# Patient Record
Sex: Female | Born: 1970 | Race: White | Hispanic: No | Marital: Married | Smoking: Never smoker
Health system: Southern US, Community
[De-identification: ages and names within clinical notes are randomized; demographics above are authoritative.]

## PROBLEM LIST (undated history)

## (undated) DIAGNOSIS — G35D Multiple sclerosis, unspecified: Secondary | ICD-10-CM

## (undated) DIAGNOSIS — G35 Multiple sclerosis: Secondary | ICD-10-CM

## (undated) DIAGNOSIS — H539 Unspecified visual disturbance: Secondary | ICD-10-CM

## (undated) DIAGNOSIS — N979 Female infertility, unspecified: Secondary | ICD-10-CM

## (undated) DIAGNOSIS — E119 Type 2 diabetes mellitus without complications: Secondary | ICD-10-CM

## (undated) HISTORY — DX: Multiple sclerosis, unspecified: G35.D

## (undated) HISTORY — DX: Female infertility, unspecified: N97.9

## (undated) HISTORY — PX: LAPAROSCOPIC APPENDECTOMY: SHX408

## (undated) HISTORY — DX: Multiple sclerosis: G35

## (undated) HISTORY — DX: Type 2 diabetes mellitus without complications: E11.9

## (undated) HISTORY — PX: EYE SURGERY: SHX253

## (undated) HISTORY — DX: Unspecified visual disturbance: H53.9

---

## 2004-05-20 ENCOUNTER — Encounter (INDEPENDENT_AMBULATORY_CARE_PROVIDER_SITE_OTHER): Payer: Self-pay | Admitting: *Deleted

## 2004-05-20 ENCOUNTER — Ambulatory Visit (HOSPITAL_COMMUNITY): Admission: RE | Admit: 2004-05-20 | Discharge: 2004-05-20 | Payer: Self-pay | Admitting: Obstetrics and Gynecology

## 2005-08-21 ENCOUNTER — Ambulatory Visit (HOSPITAL_COMMUNITY): Admission: RE | Admit: 2005-08-21 | Discharge: 2005-08-21 | Payer: Self-pay | Admitting: Obstetrics and Gynecology

## 2008-04-28 ENCOUNTER — Inpatient Hospital Stay (HOSPITAL_COMMUNITY): Admission: AD | Admit: 2008-04-28 | Discharge: 2008-04-30 | Payer: Self-pay | Admitting: Obstetrics and Gynecology

## 2008-04-28 ENCOUNTER — Encounter (INDEPENDENT_AMBULATORY_CARE_PROVIDER_SITE_OTHER): Payer: Self-pay | Admitting: Obstetrics and Gynecology

## 2010-07-26 ENCOUNTER — Ambulatory Visit (HOSPITAL_BASED_OUTPATIENT_CLINIC_OR_DEPARTMENT_OTHER)
Admission: RE | Admit: 2010-07-26 | Discharge: 2010-07-26 | Payer: Self-pay | Source: Home / Self Care | Attending: Obstetrics and Gynecology | Admitting: Obstetrics and Gynecology

## 2010-12-03 NOTE — Op Note (Signed)
NAME:  Denise Franklin, Denise Franklin           ACCOUNT NO.:  [id number]   MEDICAL RECORD NO.:  [id number]          PATIENT TYPE:  AMB   LOCATION:  SDC                           FACILITY:  [facility]   PHYSICIAN:  Maxie Better, M.D.DATE OF BIRTH:  June 05, 1971   DATE OF PROCEDURE:  05/20/2004  DATE OF DISCHARGE:                                 OPERATIVE REPORT   PREOPERATIVE DIAGNOSES:  1.  Dysfunctional uterine bleeding.  2.  Endometrial masses.   PROCEDURE:  Diagnostic hysteroscopy, resection of endometrial masses,  dilation and curettage.   POSTOPERATIVE DIAGNOSES:  1.  Dysfunctional uterine bleeding.  2.  Endometrial masses.   ANESTHESIA:  MAC, paracervical block.   SURGEON:  Maxie Better, M.D.   PROCEDURE:  Under adequate monitored anesthesia, the patient was placed in  the dorsal lithotomy position.  She was sterilely prepped and draped in the  usual fashion.  Bladder was catheterized for a large amount of urine.  Examination under anesthesia revealed a retroverted uterus.  No adnexal  masses could be appreciated.  A __________ speculum was placed in the  vagina.  The cervix was noted to be parous.  A single-tooth tenaculum was  placed on the anterior lip of the cervix after 20 cc of 1% Nesacaine was  injected paracervically.  The cervix was then serially dilated up to a #31  Pratt dilator.  A resectoscope was introduced into the uterine cavity  without incident.  On inspection, both tubal ostia could not be seen well  due to polypoid lesions noted, particularly on the right lateral aspect of  the uterus, as well as posterior and left.  These areas were resected.  The  tubal ostial areas could subsequently be seen after resection of all  polypoid lesions was then performed.  The resectoscope was then removed.  Cavity was then gently curetted.  Resectoscope was then reinserted for  inspection.  Then, the cervical canal was inspected.  No lesions noted.  The  uterine cavity was  thought to be well resected of endometrial lesions, at  which time the decision was then made to remove all instruments from the  vagina without repeating the curettage.  Specimens labeled endometrial  resections and endometrial curetting were sent to pathology.   ESTIMATED BLOOD LOSS:  Minimal.   COMPLICATIONS:  None.   The patient tolerated the procedure well and was transferred to the recovery  room in stable condition.    [place]/MEDQ  D:  05/20/2004  T:  05/21/2004  Job:  [id number]

## 2011-04-18 LAB — CBC
Hemoglobin: 12.4
Hemoglobin: 9.8 — ABNORMAL LOW
MCV: 82.6
MCV: 82.8
RBC: 3.54 — ABNORMAL LOW
RBC: 4.62
RDW: 16.1 — ABNORMAL HIGH
WBC: 11.4 — ABNORMAL HIGH

## 2011-08-03 ENCOUNTER — Other Ambulatory Visit (HOSPITAL_BASED_OUTPATIENT_CLINIC_OR_DEPARTMENT_OTHER): Payer: Self-pay | Admitting: Obstetrics and Gynecology

## 2011-08-03 DIAGNOSIS — Z1231 Encounter for screening mammogram for malignant neoplasm of breast: Secondary | ICD-10-CM

## 2011-08-09 ENCOUNTER — Ambulatory Visit (HOSPITAL_BASED_OUTPATIENT_CLINIC_OR_DEPARTMENT_OTHER)
Admission: RE | Admit: 2011-08-09 | Discharge: 2011-08-09 | Disposition: A | Discharge status: Home / Self Care | Payer: BC Managed Care – PPO | Source: Ambulatory Visit | Attending: Obstetrics and Gynecology | Admitting: Obstetrics and Gynecology

## 2011-08-09 DIAGNOSIS — Z1231 Encounter for screening mammogram for malignant neoplasm of breast: Secondary | ICD-10-CM | POA: Insufficient documentation

## 2011-12-21 ENCOUNTER — Telehealth: Payer: Self-pay | Admitting: *Deleted

## 2011-12-21 NOTE — Telephone Encounter (Signed)
Confirmed 02/02/12 genetics appt w/ pt.  Denise Franklin at referring to make aware. [place]el Neth paperwork.

## 2012-02-01 ENCOUNTER — Encounter: Payer: Self-pay | Admitting: *Deleted

## 2012-02-01 NOTE — Progress Notes (Signed)
Per Clydie Braun - pt is coming in w/ sister at 10am.  Move appt time.

## 2012-02-02 ENCOUNTER — Ambulatory Visit (HOSPITAL_BASED_OUTPATIENT_CLINIC_OR_DEPARTMENT_OTHER): Payer: BC Managed Care – PPO | Admitting: Genetic Counselor

## 2012-02-02 ENCOUNTER — Other Ambulatory Visit: Payer: BC Managed Care – PPO | Admitting: Lab

## 2012-02-02 ENCOUNTER — Encounter: Payer: Self-pay | Admitting: Genetic Counselor

## 2012-02-02 DIAGNOSIS — Z808 Family history of malignant neoplasm of other organs or systems: Secondary | ICD-10-CM

## 2012-02-02 DIAGNOSIS — Z803 Family history of malignant neoplasm of breast: Secondary | ICD-10-CM

## 2012-02-02 DIAGNOSIS — IMO0002 Reserved for concepts with insufficient information to code with codable children: Secondary | ICD-10-CM

## 2012-02-02 NOTE — Progress Notes (Signed)
Dr. Cherly Hensen requested a consultation for genetic counseling and risk assessment for Denise Franklin, a 41 y.o. female, for discussion of her family history of cancer. She presents to clinic today to discuss the possibility of a genetic predisposition to cancer, and to further clarify her risks, as well as her family members' risks for cancer.   HISTORY OF PRESENT ILLNESS: Denise Franklin is a 41 y.o. female with no personal history of cancer.    Past Medical History  Diagnosis Date  . Infertility, female     No past surgical history on file.  History  Substance Use Topics  . Smoking status: Not on file  . Smokeless tobacco: Not on file  . Alcohol Use:     REPRODUCTIVE HISTORY AND PERSONAL RISK ASSESSMENT FACTORS: Menarche was at age 26.   Premenopausal Uterus Intact: Yes Ovaries Intact: Yes G8P2A6 , first live birth at age 73  She has previously undergone treatment for infertility.   OCP use for 8 years   She has not used HRT in the past.    FAMILY HISTORY:  We obtained a detailed, 4-generation family history.  Significant diagnoses are listed below: Family History  Problem Relation Age of Onset  . Breast cancer Mother 66    Stage IV  . Kidney failure Father   . Breast cancer Sister 60    DCIS  . Melanoma Maternal Aunt 51    mets to brain, died at 33  . Lung cancer Maternal Uncle 76    smoker  . Brain cancer Maternal Grandmother 65    unknown primary cancer  . Colon cancer Maternal Grandfather 72  . Breast cancer Maternal Aunt 50    second cancer, 67  The patient has two full brothers,one full sister, two paternal half sisters and one paternal half brother.  A half sister was diagnosed with DCIS at age 37.  The patient's mother was diagnosed with breast cancer, Stage IV at age 10.  She has two sisters and two brothers.  One sister was diagnosed with breast cancer at ages 78 and 74 and died at 38.  Another sister was diagnosed with melanoma that metastases to the  brain at age 77 and died at 58. A brother died of lifestyle issues at 45 and the other brother was diagnosed with smoking related lung cancer.  Her maternal grandmother was diagnosed with brain cancer at 73, but the primary cancer was unknown. She had a sister with an unknown cancer and a mother with leukemia.  The patient's maternal grandfather was diagnosed with colon cancer at 35.  Patient's maternal ancestors are of [place] and [place] descent, and paternal ancestors are of [place], [place] and [place] descent. There is no reported Ashkenazi Jewish ancestry. There is no  known consanguinity.  GENETIC COUNSELING RISK ASSESSMENT, DISCUSSION, AND SUGGESTED FOLLOW UP: We reviewed the natural history and genetic etiology of sporadic, familial and hereditary cancer syndromes.  About 5-10% of breast cancer is hereditary.  Of this, about 85% is the result of a BRCA1 or BRCA2 mutation.  We reviewed the red flags of hereditary cancer syndromes and the dominant inheritance patterns.  If the BRCA testing is negative, we discussed that we could be testing for the wrong gene.  We discussed gene panels, and that several cancer genes that are associated with different cancers can be tested at the same time.  Because of the different types of cancer that are in the patient's family, we will consider one of the panel  tests if she is negative for BRCA mutations. We also discussed that testing someone with cancer would be more informative and we could then test the patient for a mutation if one was found in a family member.  She will talk with her mother about being tested.  The patient's family history is suggestive of the following possible diagnosis: hereditary cancer syndrome  We discussed that identification of a hereditary cancer syndrome may help her care providers tailor the patients medical management. If a mutation indicating a hereditary cancer syndrome is detected in this case, the [employer] recommendations would include increased cancer surveillance and possible prophylactic surgery. If a mutation is detected, the patient will be referred back to the referring provider and to any additional appropriate care providers to discuss the relevant options.   If a mutation is not found in the patient, this will decrease the likelihood of a hereditary cancer syndrome as the explanation for her family history. Cancer surveillance options would be discussed for the patient according to the appropriate standard National Comprehensive Cancer Network and American Cancer Society guidelines, with consideration of their personal and family history risk factors. In this case, the patient will be referred back to their care providers for discussions of management.   In order to estimate her chance of having a BRCA1 or BRCA2 mutation, we used statistical models (Penn II) and laboratory data that take into account her personal medical history, family history and ancestry.  Because each model is different, there can be a lot of variability in the risks they give.  Therefore, these numbers must be considered a rough range and not a precise risk of having a BRCA1 or BRCA2 mutation.  These models estimate that she has approximately a 5% chance of having a mutation.   After considering the risks, benefits, and limitations, the patient decided to talk with other family members before pursuing genetic testing.   Per the patient's request, we will contact her by telephone to discuss these results. A follow up genetic counseling visit will be scheduled if indicated.  The patient was seen for a total of 60 minutes, greater than 50% of which was spent face-to-face counseling.  This plan is being carried out per Dr. Purnell Shoemaker recommendations.  This note will also be sent to the referring provider via the electronic medical record. The patient will be supplied with a summary of this genetic counseling discussion as  well as educational information on the discussed hereditary cancer syndromes following the conclusion of their visit.   Patient was discussed with Dr. Drue Second.    _______________________________________________________________________ For Office Staff:  Number of people involved in session: 3 Was an Intern/ student involved with case: not applicable

## 2012-07-23 ENCOUNTER — Other Ambulatory Visit (HOSPITAL_BASED_OUTPATIENT_CLINIC_OR_DEPARTMENT_OTHER): Payer: Self-pay | Admitting: Obstetrics and Gynecology

## 2012-07-23 DIAGNOSIS — Z1231 Encounter for screening mammogram for malignant neoplasm of breast: Secondary | ICD-10-CM

## 2012-08-10 ENCOUNTER — Ambulatory Visit (HOSPITAL_BASED_OUTPATIENT_CLINIC_OR_DEPARTMENT_OTHER)
Admission: RE | Admit: 2012-08-10 | Discharge: 2012-08-10 | Disposition: A | Discharge status: Home / Self Care | Payer: BC Managed Care – PPO | Source: Ambulatory Visit | Attending: Obstetrics and Gynecology | Admitting: Obstetrics and Gynecology

## 2012-08-10 DIAGNOSIS — Z1231 Encounter for screening mammogram for malignant neoplasm of breast: Secondary | ICD-10-CM | POA: Insufficient documentation

## 2013-09-04 ENCOUNTER — Other Ambulatory Visit (HOSPITAL_BASED_OUTPATIENT_CLINIC_OR_DEPARTMENT_OTHER): Payer: Self-pay | Admitting: Obstetrics and Gynecology

## 2013-09-04 DIAGNOSIS — Z1231 Encounter for screening mammogram for malignant neoplasm of breast: Secondary | ICD-10-CM

## 2013-10-14 ENCOUNTER — Ambulatory Visit (HOSPITAL_BASED_OUTPATIENT_CLINIC_OR_DEPARTMENT_OTHER)
Admission: RE | Admit: 2013-10-14 | Discharge: 2013-10-14 | Disposition: A | Discharge status: Home / Self Care | Payer: 59 | Source: Ambulatory Visit | Attending: Obstetrics and Gynecology | Admitting: Obstetrics and Gynecology

## 2013-10-14 DIAGNOSIS — Z1231 Encounter for screening mammogram for malignant neoplasm of breast: Secondary | ICD-10-CM | POA: Insufficient documentation

## 2014-11-24 ENCOUNTER — Other Ambulatory Visit (HOSPITAL_BASED_OUTPATIENT_CLINIC_OR_DEPARTMENT_OTHER): Payer: Self-pay | Admitting: Obstetrics and Gynecology

## 2014-11-24 DIAGNOSIS — Z1231 Encounter for screening mammogram for malignant neoplasm of breast: Secondary | ICD-10-CM

## 2014-12-01 ENCOUNTER — Ambulatory Visit (HOSPITAL_BASED_OUTPATIENT_CLINIC_OR_DEPARTMENT_OTHER)
Admission: RE | Admit: 2014-12-01 | Discharge: 2014-12-01 | Disposition: A | Discharge status: Home / Self Care | Payer: 59 | Source: Ambulatory Visit | Attending: Obstetrics and Gynecology | Admitting: Obstetrics and Gynecology

## 2014-12-01 DIAGNOSIS — Z1231 Encounter for screening mammogram for malignant neoplasm of breast: Secondary | ICD-10-CM | POA: Insufficient documentation

## 2014-12-18 ENCOUNTER — Ambulatory Visit (INDEPENDENT_AMBULATORY_CARE_PROVIDER_SITE_OTHER): Payer: 59 | Admitting: Neurology

## 2014-12-18 ENCOUNTER — Encounter: Payer: Self-pay | Admitting: Neurology

## 2014-12-18 VITALS — BP 146/92 | HR 74 | Resp 16 | Ht 65.0 in | Wt 213.8 lb

## 2014-12-18 DIAGNOSIS — Z79899 Other long term (current) drug therapy: Secondary | ICD-10-CM | POA: Diagnosis not present

## 2014-12-18 DIAGNOSIS — R5383 Other fatigue: Secondary | ICD-10-CM | POA: Insufficient documentation

## 2014-12-18 DIAGNOSIS — R7989 Other specified abnormal findings of blood chemistry: Secondary | ICD-10-CM | POA: Insufficient documentation

## 2014-12-18 DIAGNOSIS — G35 Multiple sclerosis: Secondary | ICD-10-CM | POA: Diagnosis not present

## 2014-12-18 DIAGNOSIS — R945 Abnormal results of liver function studies: Secondary | ICD-10-CM

## 2014-12-18 NOTE — Progress Notes (Signed)
[facility]  PATIENT: Denise Franklin. Ayllon DOB: August 12, 1970  REFERRING DOCTOR OR PCP:  Ezequiel Ganser SOURCE: patient  _________________________________   HISTORICAL  CHIEF COMPLAINT:  Chief Complaint  Patient presents with  . Multiple Sclerosis    Sts. she tolerates Rebif with some side effects--she wakes at night with chills, but no fever.  Ibuprofen helps.  Sts. fatigue is some worse with the heat. She had a physical with her pcp last week, would like to discuss lab results.  Sts. alk phos was 131, then was rechecked 12-16-14 and was 141, so her pcp has orderd a liver u/s./fim    HISTORY OF PRESENT ILLNESS:  Denise Franklin is a 44 yo woman who presented with left arm dysesthesias that spread to the face in June 2012. She then had an MRI of the brain that was consistent with multiple sclerosis and she was diagnosed. She started on Rebif in September 2012. She continues on Rebif and generally has tolerated it well. She has had some skin reactions but they have not been severe. Since starting Rebif she has not had any more exacerbations. Follow-up MRI in 2013 and 2014 both showed that there was a stable pattern of demyelinating foci. There were no new lesions. More recently she had laboratory tests performed showing elevated alkaline phosphatase of 131 and then rechecked and still elevated at 141. Her primary care doctor has ordered a liver function tests. Unfortunate, I do not have records from [facility] neurology so I do not know what her last blood test have been.  Gait/strength/sensation: She denies any significant problem with her gait. Occasionally she feels a little off balance but this has never been a major issue for her. She denies any weakness or numbness.  Bladder: She denies any difficulty with bladder frequency or hesitancy. She is not getting urinary tract infections.  Vision: She denies any MS related vision disorder.   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 She is reluctant to consider surgery as she could end up with diplopia and she currently does not have any problems with that.  Fatigue/sleep: She does note fatigue at times. This is usually much worse when the temperatures are elevated. Sometimes, the fatigue is bad enough that she doesn't want to get off the couch. She feels it is both physical and cognitive. She generally sleeps well though she has some nights where she has mild insomnia. She has used melatonin a few times with some benefit. She does not think her insomnia is bad enough to consider medication at this time.  Mood/cognition: She denies depression. She was able to get off of Prozac last year. She some anxiety associated with some of her shots in the past but is doing much better with just ibuprofen so no longer is taking Xanax for these. She denies any significant problem with cognition.  She has noted some word finding difficulties at times and occasionally some difficulty with focus but neither of these is a severe problem for her.  REVIEW OF SYSTEMS: Constitutional: No fevers, chills, sweats, or change in appetite.  Notes  fatigue Eyes: No visual changes, double vision, eye pain Ear, nose and throat: No hearing loss, ear pain, nasal congestion, sore throat Cardiovascular: No chest pain, palpitations Respiratory: No shortness of breath at rest or with exertion.   No wheezes GastrointestinaI: No nausea, vomiting, diarrhea, abdominal pain, fecal incontinence Genitourinary: No dysuria, urinary retention or frequency.  No nocturia. Musculoskeletal: No neck pain,  back pain Integumentary: No rash, pruritus, skin lesions Neurological: as above Psychiatric: No depression at this time.  Some anxiety Endocrine: No palpitations, diaphoresis, change in appetite, change in weigh or increased thirst Hematologic/Lymphatic: No anemia, purpura, petechiae. Allergic/Immunologic: No itchy/runny eyes,  nasal congestion, recent allergic reactions, rashes  ALLERGIES: No Known Allergies  HOME MEDICATIONS:  Current outpatient prescriptions:  .  cholecalciferol (VITAMIN D) 1000 UNITS tablet, Take 2,000 Units by mouth daily., Disp: , Rfl:  .  ibuprofen (ADVIL,MOTRIN) 400 MG tablet, Take 400 mg by mouth., Disp: , Rfl:  .  interferon beta-1a (REBIF) 44 MCG/0.5ML SOSY injection, Inject into the skin., Disp: , Rfl:  .  Multiple Vitamin (MULTIVITAMIN) tablet, Take 1 tablet by mouth daily., Disp: , Rfl:  .  Omega-3 Fatty Acids (FISH OIL) 1000 MG CAPS, Take 1,000 each by mouth daily., Disp: , Rfl:   PAST MEDICAL HISTORY: Past Medical History  Diagnosis Date  . Infertility, female   . Multiple sclerosis   . Vision abnormalities     PAST SURGICAL HISTORY: Past Surgical History  Procedure Laterality Date  . Eye surgery Right   . Laparoscopic appendectomy      FAMILY HISTORY: Family History  Problem Relation Age of Onset  . Breast cancer Mother 71    Stage IV  . Kidney failure Father   . Diabetes type II Father   . Breast cancer Sister 79    DCIS  . Melanoma Maternal Aunt 51    mets to brain, died at 61  . Lung cancer Maternal Uncle 71    smoker  . Brain cancer Maternal Grandmother 36    unknown primary cancer  . Colon cancer Maternal Grandfather 41  . Breast cancer Maternal Aunt 50    second cancer, 36    SOCIAL HISTORY:  History   Social History  . Marital Status: Married    Spouse Name: N/A  . Number of Children: N/A  . Years of Education: N/A   Occupational History  . Not on file.   Social History Main Topics  . Smoking status: Never Smoker   . Smokeless tobacco: Not on file  . Alcohol Use: No  . Drug Use: No  . Sexual Activity: Not on file   Other Topics Concern  . Not on file   Social History Narrative     PHYSICAL EXAM  Filed Vitals:   12/18/14 1320  BP: 146/92  Pulse: 74  Resp: 16  Height: '5\' 5"'  (1.651 m)  Weight: 213 lb 12.8 oz (96.979 kg)     Body mass index is 35.58 kg/(m^2).   General: The patient is well-developed and well-nourished and in no acute distress  Eyes:  Funduscopic exam shows normal optic discs and retinal vessels.  Neck: The neck is supple, no carotid bruits are noted.  The neck is nontender.  Cardiovascular: The heart has a regular rate and rhythm with a normal S1 and S2. There were no murmurs, gallops or rubs. Lungs are clear to auscultation.  Skin: Extremities are without significant edema.  Musculoskeletal:  Back is nontender  Neurologic Exam  Mental status: The patient is alert and oriented x 3 at the time of the examination. The patient has apparent normal recent and remote memory, with an apparently normal attention span and concentration ability.   Speech is normal.  Cranial nerves: Extraocular movements are full. Pupils are equal, round, and reactive to light and accomodation.  Visual fields are full.  Facial symmetry is present. There  is good facial sensation to soft touch bilaterally.Facial strength is normal.  Trapezius and sternocleidomastoid strength is normal. No dysarthria is noted.  The tongue is midline, and the patient has symmetric elevation of the soft palate. No obvious hearing deficits are noted.  Motor:  Muscle bulk is normal.   Tone is normal. Strength is  5 / 5 in all 4 extremities.   Sensory: Sensory testing is intact to pinprick, soft touch and vibration sensation in all 4 extremities.  Coordination: Cerebellar testing reveals good finger-nose-finger and heel-to-shin bilaterally.  Gait and station: Station is normal.   Gait is normal. Tandem gait is normal. Romberg is negative.   Reflexes: Deep tendon reflexes are symmetric and normal bilaterally.   Plantar responses are flexor.    DIAGNOSTIC DATA (LABS, IMAGING, TESTING) - I reviewed patient records, labs, notes, testing and imaging myself where available.      ASSESSMENT AND PLAN  Multiple sclerosis - Plan:  Hepatic function panel, CBC with Differential/Platelet  Low serum vitamin D  Elevated liver function tests  Other fatigue  High risk medication use - Plan: Hepatic function panel, CBC with Differential/Platelet   1.   Check CBC with Diff and LFT to assess for lymphopenia and hepato-toxicity. I discussed with her that mild elevations of liver enzymes are unlikely to be a significant health issues to her and I would continue the Rebif. However, if she is continuing to have progressive increase in the LFTs, we would need to consider a different medication. 2.   Continue to supplement with OTC vitamin D. 3.   If the fatigue worsens, consider Nuvigil or a stimulant.  She will return to see me in 6 months or sooner if she has new or worsening neurologic symptoms. We discussed obtaining an MRI Brain around the time of her next visit he is not having subclinical progression of MS that would prompt [place] to switch medications.  Jamarie Mussa A. Felecia Shelling, MD, PhD 0/10/5995, 7:41 PM Certified in Neurology, Clinical Neurophysiology, Sleep Medicine, Pain Medicine and Neuroimaging  [facility] [address] [place], [place] [address] [phone]

## 2014-12-19 ENCOUNTER — Other Ambulatory Visit: Payer: Self-pay

## 2014-12-19 ENCOUNTER — Telehealth: Payer: Self-pay | Admitting: Neurology

## 2014-12-19 LAB — CBC WITH DIFFERENTIAL/PLATELET
BASOS: 0 %
Basophils Absolute: 0 10*3/uL (ref 0.0–0.2)
EOS (ABSOLUTE): 0.1 10*3/uL (ref 0.0–0.4)
EOS: 2 %
HEMOGLOBIN: 11.9 g/dL (ref 11.1–15.9)
Hematocrit: 38 % (ref 34.0–46.6)
Immature Grans (Abs): 0 10*3/uL (ref 0.0–0.1)
Immature Granulocytes: 0 %
LYMPHS ABS: 2.4 10*3/uL (ref 0.7–3.1)
LYMPHS: 32 %
MCH: 26 pg — AB (ref 26.6–33.0)
MCHC: 31.3 g/dL — AB (ref 31.5–35.7)
MCV: 83 fL (ref 79–97)
Monocytes Absolute: 0.6 10*3/uL (ref 0.1–0.9)
Monocytes: 8 %
NEUTROS PCT: 58 %
Neutrophils Absolute: 4.4 10*3/uL (ref 1.4–7.0)
PLATELETS: 309 10*3/uL (ref 150–379)
RBC: 4.58 x10E6/uL (ref 3.77–5.28)
RDW: 14.7 % (ref 12.3–15.4)
WBC: 7.5 10*3/uL (ref 3.4–10.8)

## 2014-12-19 LAB — HEPATIC FUNCTION PANEL
ALT: 27 IU/L (ref 0–32)
AST: 23 IU/L (ref 0–40)
Albumin: 4.2 g/dL (ref 3.5–5.5)
Alkaline Phosphatase: 135 IU/L — ABNORMAL HIGH (ref 39–117)
BILIRUBIN, DIRECT: 0.17 mg/dL (ref 0.00–0.40)
Bilirubin Total: 0.5 mg/dL (ref 0.0–1.2)
Total Protein: 7.4 g/dL (ref 6.0–8.5)

## 2014-12-19 MED ORDER — INTERFERON BETA-1A 44 MCG/0.5ML ~~LOC~~ SOSY: 44.0000 ug | PREFILLED_SYRINGE | SUBCUTANEOUS | Status: DC

## 2014-12-19 NOTE — Telephone Encounter (Signed)
I have spoken with Denise Franklin this morning and per RAS, advised that alk phos is still high at 135, but lower than it was last time (it was 141 last time), so she is ok to continue her meds.  Denise Franklin verbalized understanding of same/fim

## 2014-12-19 NOTE — Telephone Encounter (Signed)
Rx has been sent.  Receipt confirmed by pharmacy.   

## 2014-12-19 NOTE — Telephone Encounter (Signed)
Denise Franklin with [facility] called requesting refill approval for  interferon beta-1a (REBIF) 44 MCG/0.5ML SOSY injection. She may be reached at [phone] and fax #[phone].

## 2014-12-19 NOTE — Telephone Encounter (Signed)
-----  Message from Britt Bottom, MD sent at 12/19/2014 10:26 AM EDT ----- Alk phos is elevated at 135   (39-117) is normal range.    That's not that far above normal range and is better than last time she had it tested.     From my perspective, Ok to stay on medication

## 2015-06-22 ENCOUNTER — Ambulatory Visit: Payer: 59 | Admitting: Neurology

## 2015-07-06 ENCOUNTER — Ambulatory Visit: Payer: 59 | Admitting: Neurology

## 2015-07-27 ENCOUNTER — Ambulatory Visit: Payer: 59 | Admitting: Neurology

## 2015-08-18 ENCOUNTER — Ambulatory Visit (INDEPENDENT_AMBULATORY_CARE_PROVIDER_SITE_OTHER): Payer: 59 | Admitting: Neurology

## 2015-08-18 ENCOUNTER — Encounter: Payer: Self-pay | Admitting: Neurology

## 2015-08-18 VITALS — BP 130/86 | HR 62 | Resp 14 | Ht 65.0 in | Wt 203.6 lb

## 2015-08-18 DIAGNOSIS — R5383 Other fatigue: Secondary | ICD-10-CM

## 2015-08-18 DIAGNOSIS — R200 Anesthesia of skin: Secondary | ICD-10-CM

## 2015-08-18 DIAGNOSIS — Z79899 Other long term (current) drug therapy: Secondary | ICD-10-CM

## 2015-08-18 DIAGNOSIS — G5603 Carpal tunnel syndrome, bilateral upper limbs: Secondary | ICD-10-CM

## 2015-08-18 DIAGNOSIS — G35 Multiple sclerosis: Secondary | ICD-10-CM

## 2015-08-18 DIAGNOSIS — G5600 Carpal tunnel syndrome, unspecified upper limb: Secondary | ICD-10-CM | POA: Insufficient documentation

## 2015-08-18 NOTE — Progress Notes (Signed)
[facility]  PATIENT: Denise Franklin. Clune DOB: Jul 16, 1971  REFERRING DOCTOR OR PCP:  Ezequiel Ganser SOURCE: patient  _________________________________   HISTORICAL  CHIEF COMPLAINT:  Chief Complaint  Patient presents with  . Multiple Sclerosis    Sts. she continues to tolerate Rebif well.  Sts. she is having more left knee pain--sts. she is trying to walk more and wonders if this is causing knee pain.  She thinks her vision is a bit more blurry bilat.  She didn't bring her glasses today so doesn't think an acuity will be accurate.  She has an appt. with opthalmology for her yearly check in 3 weeks./fim    HISTORY OF PRESENT ILLNESS:  Rheta Franklin is a 45 yo woman with MS.   She is on Rebif.   Her Alk Phos has been elevated but enzymes were fine at last check in June.  She denies any exacerbations. MRI 2013 and 2014 showed stable pattern of demyelinating lesions.   Over the last month, she has had left knee pain and feels more fatigued.   She also has had some bouts of a nervous feeling.     Gait/strength/sensation: She denies any significant problem with her gait. Now and then, she feels off balance but she never falls.   She denies any weakness.   She notes a painful tingling in her hands in the morning.   It is in the palm and dorsum.  Shaking helps resolve it.   Marland KitchenShe was once told she had carpal tunnel syndrome.     Bladder: She denies any difficulty with bladder frequency or hesitancy. She is not getting urinary tract infections.  Vision: She denies any MS related vision disorder.   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   She feels vision is worse with reading and sometimes uses reader bifocals.    She will be seeing ophtho in 2 - 3 weeks.      Fatigue/sleep: She has fatigue at times, especially later in the day. This is also worse when the temperatures are elevated. Fatigue is both physical and cognitive. Sleep is good most  nights.   She has used melatonin a few times with some benefit if she has any insomnia.   Mood/cognition: She denies depression at this time.   However, she has had some episodes lasting a few minutes with a fluttering sensation inside and a nervous feeling and feeling she might pass out.  .She has taken Xanax (1/2 of a 0.5 mg) for these occasional spells (only 2 last month and 4 last year).  Her pulse was not rapid the one time (friend is a nurse was with her). She denies any significant problem with cognition.  She has noted some word finding difficulties at times and occasionally some difficulty with focus but neither of these is a severe problem for her.  MS History:   She presented with left arm dysesthesias that spread to the face in June 2012. She then had an MRI of the brain that was consistent with multiple sclerosis and she was diagnosed. She started on Rebif in September 2012. She continues on Rebif and generally has tolerated it well. She has had some skin reactions but they have not been severe. Since starting Rebif she has not had any more exacerbations. Follow-up MRI in 2013 and 2014 both showed that there was a stable pattern of demyelinating foci. There were no new lesions.   REVIEW OF SYSTEMS: Constitutional: No  fevers, chills, sweats, or change in appetite.  Notes  fatigue Eyes: No visual changes, double vision, eye pain Ear, nose and throat: No hearing loss, ear pain, nasal congestion, sore throat Cardiovascular: No chest pain, palpitations Respiratory: No shortness of breath at rest or with exertion.   No wheezes GastrointestinaI: No nausea, vomiting, diarrhea, abdominal pain, fecal incontinence Genitourinary: No dysuria, urinary retention or frequency.  No nocturia. Musculoskeletal: No neck pain, back pain Integumentary: No rash, pruritus, skin lesions Neurological: as above Psychiatric: No depression at this time.  Some anxiety Endocrine: No palpitations, diaphoresis,  change in appetite, change in weigh or increased thirst Hematologic/Lymphatic: No anemia, purpura, petechiae. Allergic/Immunologic: No itchy/runny eyes, nasal congestion, recent allergic reactions, rashes  ALLERGIES: No Known Allergies  HOME MEDICATIONS:  Current outpatient prescriptions:  .  ibuprofen (ADVIL,MOTRIN) 400 MG tablet, Take 400 mg by mouth., Disp: , Rfl:  .  interferon beta-1a (REBIF) 44 MCG/0.5ML SOSY injection, Inject 0.5 mLs (44 mcg total) into the skin 3 (three) times a week., Disp: 12 Syringe, Rfl: 11 .  Multiple Vitamin (MULTIVITAMIN) tablet, Take 1 tablet by mouth daily., Disp: , Rfl:  .  Omega-3 Fatty Acids (FISH OIL) 1000 MG CAPS, Take 1,000 each by mouth daily., Disp: , Rfl:  .  cholecalciferol (VITAMIN D) 1000 UNITS tablet, Take 2,000 Units by mouth daily. Reported on 08/18/2015, Disp: , Rfl:   PAST MEDICAL HISTORY: Past Medical History  Diagnosis Date  . Infertility, female   . Multiple sclerosis ([place])   . Vision abnormalities     PAST SURGICAL HISTORY: Past Surgical History  Procedure Laterality Date  . Eye surgery Right   . Laparoscopic appendectomy      FAMILY HISTORY: Family History  Problem Relation Age of Onset  . Breast cancer Mother 35    Stage IV  . Kidney failure Father   . Diabetes type II Father   . Breast cancer Sister 44    DCIS  . Melanoma Maternal Aunt 51    mets to brain, died at 56  . Lung cancer Maternal Uncle 73    smoker  . Brain cancer Maternal Grandmother 18    unknown primary cancer  . Colon cancer Maternal Grandfather 92  . Breast cancer Maternal Aunt 50    second cancer, 32    SOCIAL HISTORY:  Social History   Social History  . Marital Status: Married    Spouse Name: N/A  . Number of Children: N/A  . Years of Education: N/A   Occupational History  . Not on file.   Social History Main Topics  . Smoking status: Never Smoker   . Smokeless tobacco: Not on file  . Alcohol Use: No  . Drug Use: No  .  Sexual Activity: Not on file   Other Topics Concern  . Not on file   Social History Narrative     PHYSICAL EXAM  Filed Vitals:   08/18/15 0848  BP: 130/86  Pulse: 62  Resp: 14  Height: '5\' 5"'  (1.651 m)  Weight: 203 lb 9.6 oz (92.352 kg)    Body mass index is 33.88 kg/(m^2).   General: The patient is well-developed and well-nourished and in no acute distress   Musculoskeletal:  Back is nontender  Neurologic Exam  Mental status: The patient is alert and oriented x 3 at the time of the examination. The patient has apparent normal recent and remote memory, with an apparently normal attention span and concentration ability.   Speech is normal.  Cranial nerves: Extraocular movements are full (not conjugate with alternating exotropia though. Pupils are equal, round, and reactive to light and accomodation.  Facial symmetry is present. There is good facial sensation to soft touch bilaterally.Facial strength is normal.  Trapezius and sternocleidomastoid strength is normal. No dysarthria is noted.  The tongue is midline, and the patient has symmetric elevation of the soft palate. No obvious hearing deficits are noted.  Motor:  Muscle bulk is normal.   Tone is normal. Strength is  5 / 5 in all 4 extremities.   Sensory: Sensory testing is intact to touch and vibration sensation in all 4 extremities.  Coordination: Cerebellar testing reveals good finger-nose-finger and heel-to-shin bilaterally.  Gait and station: Station is normal.   Gait is normal. Tandem gait is normal. Romberg is negative.   Reflexes: Deep tendon reflexes are symmetric and normal bilaterally.     Other:   Phalen's sign on the left    DIAGNOSTIC DATA (LABS, IMAGING, TESTING) - I reviewed patient records, labs, notes, testing and imaging myself where available.      ASSESSMENT AND PLAN  Multiple sclerosis ([facility])  High risk medication use  Other fatigue  Numbness  Bilateral carpal tunnel  syndrome    1.   Check CBC with Diff and LFT to assess for lymphopenia and hepato-toxicity. Alk Phos has been high in the past 2.   MRI of the brain with/without.    If any subclinical change, consider a different DMT.  3.   She will return to see me in 6 months or sooner if she has new or worsening neurologic symptoms.   Viktor Philipp A. Felecia Shelling, MD, PhD 1/58/3094, 0:76 AM Certified in Neurology, Clinical Neurophysiology, Sleep Medicine, Pain Medicine and Neuroimaging  [facility] [address], [place], [place] [address] [phone]

## 2015-08-19 ENCOUNTER — Telehealth: Payer: Self-pay | Admitting: *Deleted

## 2015-08-19 LAB — CBC WITH DIFFERENTIAL/PLATELET
BASOS ABS: 0 10*3/uL (ref 0.0–0.2)
Basos: 0 %
EOS (ABSOLUTE): 0.2 10*3/uL (ref 0.0–0.4)
Eos: 3 %
Hematocrit: 39.3 % (ref 34.0–46.6)
Hemoglobin: 12.6 g/dL (ref 11.1–15.9)
IMMATURE GRANS (ABS): 0 10*3/uL (ref 0.0–0.1)
Immature Granulocytes: 0 %
LYMPHS ABS: 2.2 10*3/uL (ref 0.7–3.1)
LYMPHS: 33 %
MCH: 26.9 pg (ref 26.6–33.0)
MCHC: 32.1 g/dL (ref 31.5–35.7)
MCV: 84 fL (ref 79–97)
MONOCYTES: 7 %
Monocytes Absolute: 0.5 10*3/uL (ref 0.1–0.9)
NEUTROS ABS: 3.9 10*3/uL (ref 1.4–7.0)
Neutrophils: 57 %
PLATELETS: 296 10*3/uL (ref 150–379)
RBC: 4.69 x10E6/uL (ref 3.77–5.28)
RDW: 16 % — ABNORMAL HIGH (ref 12.3–15.4)
WBC: 6.8 10*3/uL (ref 3.4–10.8)

## 2015-08-19 LAB — HEPATIC FUNCTION PANEL
ALBUMIN: 4.1 g/dL (ref 3.5–5.5)
ALT: 31 IU/L (ref 0–32)
AST: 19 IU/L (ref 0–40)
Alkaline Phosphatase: 121 IU/L — ABNORMAL HIGH (ref 39–117)
Bilirubin Total: 0.2 mg/dL (ref 0.0–1.2)
Bilirubin, Direct: 0.08 mg/dL (ref 0.00–0.40)
TOTAL PROTEIN: 7.4 g/dL (ref 6.0–8.5)

## 2015-08-19 NOTE — Telephone Encounter (Signed)
-----   Message from Asa Lente, MD sent at 08/19/2015  9:05 AM EST ----- Labs are fine. The alkaline phosphatase is just minimally elevated to 121 (normal is less than 117)

## 2015-08-19 NOTE — Telephone Encounter (Signed)
I have spoken with Denise Franklin this morning and per RAS, advised that labwork looks fine; alk phos. was just minimally elevated at 121--normal is less than 117.  She verbalized understanding of same/fim

## 2015-09-18 DIAGNOSIS — R200 Anesthesia of skin: Secondary | ICD-10-CM | POA: Diagnosis not present

## 2015-09-18 DIAGNOSIS — G35 Multiple sclerosis: Secondary | ICD-10-CM | POA: Diagnosis not present

## 2015-09-21 ENCOUNTER — Ambulatory Visit (INDEPENDENT_AMBULATORY_CARE_PROVIDER_SITE_OTHER): Payer: Self-pay

## 2015-09-21 ENCOUNTER — Telehealth: Payer: Self-pay | Admitting: Neurology

## 2015-09-21 DIAGNOSIS — R200 Anesthesia of skin: Secondary | ICD-10-CM

## 2015-09-21 DIAGNOSIS — G35 Multiple sclerosis: Secondary | ICD-10-CM

## 2015-09-21 DIAGNOSIS — Z0289 Encounter for other administrative examinations: Secondary | ICD-10-CM

## 2015-09-21 NOTE — Telephone Encounter (Signed)
I have spoken with Denise Franklin and advised mri results not available yet (it was just done this am); I will call her as soon as RAS has read it.  She verbalized understanding of same/fim

## 2015-09-21 NOTE — Telephone Encounter (Signed)
Pt called requesting MRI results.  

## 2015-09-22 ENCOUNTER — Telehealth: Payer: Self-pay | Admitting: *Deleted

## 2015-09-22 NOTE — Telephone Encounter (Signed)
Patient returned your call and requests a call back.  Thanks! °

## 2015-09-22 NOTE — Telephone Encounter (Signed)
-----   Message from Richard A Sater, MD sent at 09/21/2015  6:32 PM EST ----- There are no new MS lesions.   Please let her know that the MRI is stable when compared to the one performed a couple years ago. 

## 2015-09-22 NOTE — Telephone Encounter (Signed)
LMTC./fim 

## 2015-09-22 NOTE — Telephone Encounter (Signed)
-----   Message from Asa Lente, MD sent at 09/21/2015  6:32 PM EST ----- There are no new MS lesions.   Please let her know that the MRI is stable when compared to the one performed a couple years ago.

## 2015-09-22 NOTE — Telephone Encounter (Signed)
I have spoken with Denise Franklin this morning and per RAS, advised that mri shows no new MS lesions; it is stable when compared to mri done a couple of yrs. ago.  She verbalized understanding of same/fim

## 2015-11-04 ENCOUNTER — Other Ambulatory Visit: Payer: Self-pay | Admitting: *Deleted

## 2015-11-04 MED ORDER — INTERFERON BETA-1A 44 MCG/0.5ML ~~LOC~~ SOSY: 44.0000 ug | PREFILLED_SYRINGE | SUBCUTANEOUS | Status: DC

## 2015-11-04 NOTE — Telephone Encounter (Signed)
Rebif rx. escribed to [facility] fax # [phone]/fim

## 2016-01-20 ENCOUNTER — Other Ambulatory Visit (HOSPITAL_BASED_OUTPATIENT_CLINIC_OR_DEPARTMENT_OTHER): Payer: Self-pay | Admitting: *Deleted

## 2016-01-20 DIAGNOSIS — Z1231 Encounter for screening mammogram for malignant neoplasm of breast: Secondary | ICD-10-CM

## 2016-01-26 ENCOUNTER — Ambulatory Visit (HOSPITAL_BASED_OUTPATIENT_CLINIC_OR_DEPARTMENT_OTHER)
Admission: RE | Admit: 2016-01-26 | Discharge: 2016-01-26 | Disposition: A | Discharge status: Home / Self Care | Payer: 59 | Source: Ambulatory Visit | Attending: *Deleted | Admitting: *Deleted

## 2016-01-26 DIAGNOSIS — Z1231 Encounter for screening mammogram for malignant neoplasm of breast: Secondary | ICD-10-CM | POA: Diagnosis present

## 2016-01-28 ENCOUNTER — Ambulatory Visit (HOSPITAL_BASED_OUTPATIENT_CLINIC_OR_DEPARTMENT_OTHER): Payer: 59

## 2016-02-23 ENCOUNTER — Ambulatory Visit: Payer: 59 | Admitting: Neurology

## 2016-03-29 ENCOUNTER — Telehealth: Payer: Self-pay | Admitting: Neurology

## 2016-03-29 ENCOUNTER — Ambulatory Visit (INDEPENDENT_AMBULATORY_CARE_PROVIDER_SITE_OTHER): Payer: 59 | Admitting: Neurology

## 2016-03-29 ENCOUNTER — Encounter: Payer: Self-pay | Admitting: Neurology

## 2016-03-29 VITALS — BP 126/84 | HR 72 | Resp 16 | Ht 65.0 in | Wt 217.0 lb

## 2016-03-29 DIAGNOSIS — R200 Anesthesia of skin: Secondary | ICD-10-CM | POA: Diagnosis not present

## 2016-03-29 DIAGNOSIS — Z79899 Other long term (current) drug therapy: Secondary | ICD-10-CM

## 2016-03-29 DIAGNOSIS — E669 Obesity, unspecified: Secondary | ICD-10-CM

## 2016-03-29 DIAGNOSIS — G5603 Carpal tunnel syndrome, bilateral upper limbs: Secondary | ICD-10-CM

## 2016-03-29 DIAGNOSIS — F909 Attention-deficit hyperactivity disorder, unspecified type: Secondary | ICD-10-CM | POA: Diagnosis not present

## 2016-03-29 DIAGNOSIS — R5383 Other fatigue: Secondary | ICD-10-CM

## 2016-03-29 DIAGNOSIS — R945 Abnormal results of liver function studies: Secondary | ICD-10-CM

## 2016-03-29 DIAGNOSIS — R7989 Other specified abnormal findings of blood chemistry: Secondary | ICD-10-CM | POA: Diagnosis not present

## 2016-03-29 DIAGNOSIS — F988 Other specified behavioral and emotional disorders with onset usually occurring in childhood and adolescence: Secondary | ICD-10-CM | POA: Insufficient documentation

## 2016-03-29 DIAGNOSIS — R0683 Snoring: Secondary | ICD-10-CM | POA: Insufficient documentation

## 2016-03-29 DIAGNOSIS — G35 Multiple sclerosis: Secondary | ICD-10-CM | POA: Diagnosis not present

## 2016-03-29 DIAGNOSIS — G4719 Other hypersomnia: Secondary | ICD-10-CM

## 2016-03-29 MED ORDER — PHENTERMINE HCL 37.5 MG PO CAPS: 37.5000 mg | ORAL_CAPSULE | ORAL | 5 refills | Status: DC

## 2016-03-29 MED ORDER — ALPRAZOLAM 0.5 MG PO TABS: ORAL_TABLET | ORAL | 0 refills | Status: DC

## 2016-03-29 NOTE — Progress Notes (Signed)
[facility]  PATIENT: Denise Franklin. Andress DOB: Apr 23, 1971  REFERRING DOCTOR OR PCP:  Ezequiel Ganser SOURCE: patient  _________________________________   HISTORICAL  CHIEF COMPLAINT:  Chief Complaint  Patient presents with  . Multiple Sclerosis    Sts. she continues to tolerate Rebif well.  Sts. fatigue is intermittently worse--is worse today, but may be ok tomorrow./fim    HISTORY OF PRESENT ILLNESS:  Denise Franklin is a 45 yo woman with MS.   She is on Rebif.   Her Alk Phos has been elevated but enzymes were fine at last check in June.  She denies any exacerbations. MRI 09/2015 was unchanged to a 2014 MR with stable pattern of demyelinating lesions.   She feels more fatigued. Other symptoms are about the same.   .     Gait/strength/sensation: She denies any significant problem with her gait. Now and then, she feels off balance but she never falls.   She denies any weakness but tires out more easily.   She reports an occasional L > R painful tingling in her hands in the morning.   It is in the palm and dorsum.  Shaking helps resolve it.  Many years ago, she was diagnosed with carpal tunnel syndrome.     Bladder: She denies any difficulty with bladder frequency or hesitancy. She is not getting urinary tract infections.  Vision: She denies any MS related vision disorder.   She has had alternating exotropia since childhood.   A recent eye exam was unchanged.  Fatigue/sleep: She has fatigue most days, especially later in the day. This is also worse when the temperatures are elevated. Fatigue is both physical and cognitive. Sleep is good most nights (11 pm to 630 am).   She falls asleep often watching TV.    She snores but has never had gasping or pauses noted (she has asked husband).     EPWORTH SLEEPINESS SCALE  On a scale of 0 - 3 what is the chance of dozing:  Sitting and Reading:   2 Watching TV:    3 Sitting inactive in a public place: 3 Passenger in car  for one hour: 3 Lying down to rest in the afternoon: 3 Sitting and talking to someone: 0 Sitting quietly after lunch:  3 In a car, stopped in traffic:  0  Total (out of 24):    17/24  Mood/attention/cognition: She denies depression at this time. .She has occasional panic type spells x years (only one a month or less).   Xanax helps when she has one. She denies any significant problem with cognition but attention is poor nd she is very distractible.  Also, she has word finding difficulties at times  MS History:   She presented with left arm dysesthesias that spread to the face in June 2012. She then had an MRI of the brain that was consistent with multiple sclerosis and she was diagnosed. She started on Rebif in September 2012. She continues on Rebif and generally has tolerated it well. She has had some skin reactions but they have not been severe. Since starting Rebif she has not had any more exacerbations. Follow-up MRI in 2013 and 2014 both showed that there was a stable pattern of demyelinating foci. There were no new lesions.   REVIEW OF SYSTEMS: Constitutional: No fevers, chills, sweats, or change in appetite.  Notes  fatigue Eyes: No visual changes, double vision, eye pain Ear, nose and throat: No hearing loss, ear pain, nasal congestion, sore  throat Cardiovascular: No chest pain, palpitations Respiratory: No shortness of breath at rest or with exertion.   No wheezes GastrointestinaI: No nausea, vomiting, diarrhea, abdominal pain, fecal incontinence Genitourinary: No dysuria, urinary retention or frequency.  No nocturia. Musculoskeletal: No neck pain, back pain Integumentary: No rash, pruritus, skin lesions Neurological: as above Psychiatric: No depression at this time.  Some anxiety Endocrine: No palpitations, diaphoresis, change in appetite, change in weigh or increased thirst Hematologic/Lymphatic: No anemia, purpura, petechiae. Allergic/Immunologic: No itchy/runny eyes, nasal  congestion, recent allergic reactions, rashes  ALLERGIES: No Known Allergies  HOME MEDICATIONS:  Current Outpatient Prescriptions:  .  ibuprofen (ADVIL,MOTRIN) 400 MG tablet, Take 400 mg by mouth., Disp: , Rfl:  .  interferon beta-1a (REBIF) 44 MCG/0.5ML SOSY injection, Inject 0.5 mLs (44 mcg total) into the skin 3 (three) times a week., Disp: 36 Syringe, Rfl: 3 .  Multiple Vitamin (MULTIVITAMIN) tablet, Take 1 tablet by mouth daily., Disp: , Rfl:  .  Omega-3 Fatty Acids (FISH OIL) 1000 MG CAPS, Take 1,000 each by mouth daily., Disp: , Rfl:  .  ALPRAZolam (XANAX) 0.5 MG tablet, Use as needed for panic attack, Disp: 20 tablet, Rfl: 0 .  cholecalciferol (VITAMIN D) 1000 UNITS tablet, Take 2,000 Units by mouth daily. Reported on 08/18/2015, Disp: , Rfl:  .  phentermine 37.5 MG capsule, Take 1 capsule (37.5 mg total) by mouth every morning., Disp: 30 capsule, Rfl: 5  PAST MEDICAL HISTORY: Past Medical History:  Diagnosis Date  . Infertility, female   . Multiple sclerosis ([place])   . Vision abnormalities     PAST SURGICAL HISTORY: Past Surgical History:  Procedure Laterality Date  . EYE SURGERY Right   . LAPAROSCOPIC APPENDECTOMY      FAMILY HISTORY: Family History  Problem Relation Age of Onset  . Breast cancer Mother 59    Stage IV  . Kidney failure Father   . Diabetes type II Father   . Breast cancer Sister 31    DCIS  . Melanoma Maternal Aunt 51    mets to brain, died at 31  . Lung cancer Maternal Uncle 63    smoker  . Brain cancer Maternal Grandmother 46    unknown primary cancer  . Colon cancer Maternal Grandfather 23  . Breast cancer Maternal Aunt 50    second cancer, 62    SOCIAL HISTORY:  Social History   Social History  . Marital status: Married    Spouse name: N/A  . Number of children: N/A  . Years of education: N/A   Occupational History  . Not on file.   Social History Main Topics  . Smoking status: Never Smoker  . Smokeless tobacco: Not on file   . Alcohol use No  . Drug use: No  . Sexual activity: Not on file   Other Topics Concern  . Not on file   Social History Narrative  . No narrative on file     PHYSICAL EXAM  Vitals:   03/29/16 1102  BP: 126/84  Pulse: 72  Resp: 16  Weight: 217 lb (98.4 kg)  Height: _0  (1.651 m)    Body mass index is 36.11 kg/m.   General: The patient is well-developed and well-nourished and in no acute distress   Musculoskeletal:  Back is nontender  Neurologic Exam  Mental status: The patient is alert and oriented x 3 at the time of the examination. The patient has apparent normal recent and remote memory, with an apparently normal  attention span and concentration ability.   Speech is normal.  Cranial nerves: Extraocular movements are full (not conjugate with alternating exotropia though. Pupils are equal, round, and reactive to light and accomodation.  Facial symmetry is present. There is good facial sensation to soft touch bilaterally.Facial strength is normal.  Trapezius and sternocleidomastoid strength is normal. No dysarthria is noted.  The tongue is midline, and the patient has symmetric elevation of the soft palate. No obvious hearing deficits are noted.  Motor:  Muscle bulk is normal.   Tone is normal. Strength is  5 / 5 in all 4 extremities.   APB strength is 4+/5 bilaterally.  Sensory: Sensory testing is intact to touch and vibration sensation in all 4 extremities.  Coordination: Cerebellar testing reveals good finger-nose-finger and heel-to-shin bilaterally.  Gait and station: Station is normal.   Gait is normal. Tandem gait is normal. Romberg is negative.   Reflexes: Deep tendon reflexes are symmetric and normal bilaterally.     Other:   Phalen's sign on the left    DIAGNOSTIC DATA (LABS, IMAGING, TESTING) - I reviewed patient records, labs, notes, testing and imaging myself where available.      ASSESSMENT AND PLAN  Multiple sclerosis ([place]) - Plan: CBC with  Differential/Platelet, Hepatic function panel  Other fatigue  High risk medication use - Plan: CBC with Differential/Platelet, Hepatic function panel  Numbness  Bilateral carpal tunnel syndrome  Attention deficit disorder  Obesity  Elevated liver function tests  Snoring  Excessive daytime sleepiness   1.   Check CBC with Diff and LFT to assess for lymphopenia and hepato-toxicity.   2.  Renew med's.    Add phentermine for weight loss and attention deficit  3.   Stay active and exercise as tolerated. 4.   Wear wrist splints for CTS 5.   We discussed the possibility of obstructive sleep apnea as she snores and has excessive daytime sleepiness. She would like to hold off on a sleep study but we'll reconsider if her symptoms worsen.  6.   She will return to see me in 6 months or sooner if she has new or worsening neurologic symptoms.   45 minute face-to-face evaluation with greater than one half of the time counseling and coordinating care about her MS and related symptoms.  Windsor Zirkelbach A. Felecia Shelling, MD, PhD 1/44/8185, 6:31 PM Certified in Neurology, Clinical Neurophysiology, Sleep Medicine, Pain Medicine and Neuroimaging  [facility] [address] [place], [place] [address] [phone]

## 2016-03-29 NOTE — Telephone Encounter (Signed)
April with [facility] in [facility] is calling to get clarification on medication ALPRAZolam (XANAX) 0.5 MG tablet for the patient.

## 2016-03-29 NOTE — Telephone Encounter (Signed)
I have spoken with Denise Franklin at [facility] this afternoon and clarified rx/fim

## 2016-03-30 ENCOUNTER — Telehealth: Payer: Self-pay | Admitting: *Deleted

## 2016-03-30 LAB — CBC WITH DIFFERENTIAL/PLATELET
BASOS ABS: 0 10*3/uL (ref 0.0–0.2)
BASOS: 0 %
EOS (ABSOLUTE): 0.1 10*3/uL (ref 0.0–0.4)
Eos: 2 %
HEMOGLOBIN: 11.7 g/dL (ref 11.1–15.9)
Hematocrit: 36 % (ref 34.0–46.6)
IMMATURE GRANS (ABS): 0 10*3/uL (ref 0.0–0.1)
IMMATURE GRANULOCYTES: 0 %
LYMPHS: 16 %
Lymphocytes Absolute: 1 10*3/uL (ref 0.7–3.1)
MCH: 26.6 pg (ref 26.6–33.0)
MCHC: 32.5 g/dL (ref 31.5–35.7)
MCV: 82 fL (ref 79–97)
MONOCYTES: 10 %
Monocytes Absolute: 0.7 10*3/uL (ref 0.1–0.9)
NEUTROS ABS: 4.6 10*3/uL (ref 1.4–7.0)
NEUTROS PCT: 72 %
PLATELETS: 283 10*3/uL (ref 150–379)
RBC: 4.4 x10E6/uL (ref 3.77–5.28)
RDW: 14.5 % (ref 12.3–15.4)
WBC: 6.4 10*3/uL (ref 3.4–10.8)

## 2016-03-30 LAB — HEPATIC FUNCTION PANEL
ALK PHOS: 114 IU/L (ref 39–117)
ALT: 28 IU/L (ref 0–32)
AST: 22 IU/L (ref 0–40)
Albumin: 4.1 g/dL (ref 3.5–5.5)
BILIRUBIN TOTAL: 0.5 mg/dL (ref 0.0–1.2)
BILIRUBIN, DIRECT: 0.16 mg/dL (ref 0.00–0.40)
TOTAL PROTEIN: 7 g/dL (ref 6.0–8.5)

## 2016-03-30 NOTE — Telephone Encounter (Signed)
-----   Message from Asa Lente, MD sent at 03/30/2016  9:07 AM EDT ----- Please let her know that the lab work is normal.

## 2016-03-30 NOTE — Telephone Encounter (Signed)
I have spoken with Kaeli this afternoon and per RAS, advised that labs done in our office yesterday look good.  She should continue meds as rx'd.  She verbalized understanding of same/fim

## 2016-07-29 ENCOUNTER — Ambulatory Visit: Payer: 59 | Admitting: Neurology

## 2016-08-25 ENCOUNTER — Encounter: Payer: Self-pay | Admitting: Neurology

## 2016-08-25 ENCOUNTER — Ambulatory Visit (INDEPENDENT_AMBULATORY_CARE_PROVIDER_SITE_OTHER): Payer: 59 | Admitting: Neurology

## 2016-08-25 VITALS — BP 142/93 | HR 94 | Resp 16 | Ht 65.0 in | Wt 199.0 lb

## 2016-08-25 DIAGNOSIS — R7989 Other specified abnormal findings of blood chemistry: Secondary | ICD-10-CM

## 2016-08-25 DIAGNOSIS — Z6830 Body mass index (BMI) 30.0-30.9, adult: Secondary | ICD-10-CM

## 2016-08-25 DIAGNOSIS — Z79899 Other long term (current) drug therapy: Secondary | ICD-10-CM

## 2016-08-25 DIAGNOSIS — E669 Obesity, unspecified: Secondary | ICD-10-CM

## 2016-08-25 DIAGNOSIS — F988 Other specified behavioral and emotional disorders with onset usually occurring in childhood and adolescence: Secondary | ICD-10-CM

## 2016-08-25 DIAGNOSIS — R945 Abnormal results of liver function studies: Secondary | ICD-10-CM

## 2016-08-25 DIAGNOSIS — G35 Multiple sclerosis: Secondary | ICD-10-CM | POA: Diagnosis not present

## 2016-08-25 MED ORDER — PHENTERMINE HCL 37.5 MG PO CAPS: 37.5000 mg | ORAL_CAPSULE | ORAL | 5 refills | Status: DC

## 2016-08-25 NOTE — Progress Notes (Signed)
[facility]  PATIENT: Denise Franklin. Grenfell DOB: 09-15-1970  REFERRING DOCTOR OR PCP:  Vivien Presto SOURCE: patient  _________________________________   HISTORICAL  CHIEF COMPLAINT:  Chief Complaint  Patient presents with  . Multiple Sclerosis    Sts. she continues to tolerate Rebif well.  Is currently being treated for a UTI.  Has lost 18 lbs and sts. fatigue is some better since starting Phentermine/fim    HISTORY OF PRESENT ILLNESS:  Denise Franklin is a 46 yo woman with MS.     MS:   She is on Reif and tolerates it well.   She has no new exacerbations.   MRI 09/2015 was unchanged to a 2014 MRI.   Symptoms are about the same.   .     Gait/strength/sensation: She notes no major problems with gait but feels off balanced at times.   No falls.   She denies any weakness but tires out more easily.   She reports an occasional L > R painful tingling in her hands in the morning.   It is in the palm and dorsum.  Shaking helps resolve it.  Many years ago, she was diagnosed with carpal tunnel syndrome.     Bladder: She denies any difficulty with bladder frequency or hesitancy. She had a urinary tract infection this week which is unusual and she is also being evaluated for possible ovarian cyst.     Vision: She denies any MS related vision disorder.  No diplopia but she has a blurriness at times.   She has had alternating exotropia since childhood.  She sees ophtho annually.  Fatigue/sleep: She has fatigue some days, especially later in the day. Phentermine has helped hte fatigue  Sleep is good with 7+ hours most nights.  She has less daytime sleepiness on Phentermine.       She snores but has never had gasping or pauses noted (she has asked husband).     Mood/attention/cognition: She feels mood is ok.   She denies depression. .She has rare panic type spells x years (only one a month or less).   Xanax helps when she has one. She denies any significant problem with  cognition but attention is poor nd she is very distractible.  Also, she has word finding difficulties at times.   She is able to work as a part time [employer].    Weight:  She has loss 18 pounds since starting Phentermine  MS History:   She presented with left arm dysesthesias that spread to the face in June 2012. She then had an MRI of the brain that was consistent with multiple sclerosis and she was diagnosed. She started on Rebif in September 2012. She continues on Rebif and generally has tolerated it well. She has had some skin reactions but they have not been severe. Since starting Rebif she has not had any more exacerbations. Follow-up MRI in 2013 and 2014 both showed that there was a stable pattern of demyelinating foci. There were no new lesions.   REVIEW OF SYSTEMS: Constitutional: No fevers, chills, sweats, or change in appetite.  Notes  fatigue Eyes: No visual changes, double vision, eye pain Ear, nose and throat: No hearing loss, ear pain, nasal congestion, sore throat Cardiovascular: No chest pain, palpitations Respiratory: No shortness of breath at rest or with exertion.   No wheezes GastrointestinaI: No nausea, vomiting, diarrhea, abdominal pain, fecal incontinence Genitourinary: No dysuria, urinary retention or frequency.  No nocturia. Musculoskeletal: No neck pain, back pain  Integumentary: No rash, pruritus, skin lesions Neurological: as above Psychiatric: No depression at this time.  Some anxiety Endocrine: No palpitations, diaphoresis, change in appetite, change in weigh or increased thirst Hematologic/Lymphatic: No anemia, purpura, petechiae. Allergic/Immunologic: No itchy/runny eyes, nasal congestion, recent allergic reactions, rashes  ALLERGIES: No Known Allergies  HOME MEDICATIONS:  Current Outpatient Prescriptions:  .  ALPRAZolam (XANAX) 0.5 MG tablet, Use as needed for panic attack, Disp: 20 tablet, Rfl: 0 .  ibuprofen (ADVIL,MOTRIN) 400 MG tablet, Take 400  mg by mouth., Disp: , Rfl:  .  interferon beta-1a (REBIF) 44 MCG/0.5ML SOSY injection, Inject 0.5 mLs (44 mcg total) into the skin 3 (three) times a week., Disp: 36 Syringe, Rfl: 3 .  Multiple Vitamin (MULTIVITAMIN) tablet, Take 1 tablet by mouth daily., Disp: , Rfl:  .  nitrofurantoin, macrocrystal-monohydrate, (MACROBID) 100 MG capsule, Take by mouth., Disp: , Rfl:  .  Omega-3 Fatty Acids (FISH OIL) 1000 MG CAPS, Take 1,000 each by mouth daily., Disp: , Rfl:  .  phentermine 37.5 MG capsule, Take 1 capsule (37.5 mg total) by mouth every morning., Disp: 30 capsule, Rfl: 5 .  cholecalciferol (VITAMIN D) 1000 UNITS tablet, Take 2,000 Units by mouth daily. Reported on 08/18/2015, Disp: , Rfl:   PAST MEDICAL HISTORY: Past Medical History:  Diagnosis Date  . Infertility, female   . Multiple sclerosis (HCC)   . Vision abnormalities     PAST SURGICAL HISTORY: Past Surgical History:  Procedure Laterality Date  . EYE SURGERY Right   . LAPAROSCOPIC APPENDECTOMY      FAMILY HISTORY: Family History  Problem Relation Age of Onset  . Breast cancer Mother 86    Stage IV  . Kidney failure Father   . Diabetes type II Father   . Breast cancer Sister 99    DCIS  . Melanoma Maternal Aunt 51    mets to brain, died at 79  . Lung cancer Maternal Uncle 40    smoker  . Brain cancer Maternal Grandmother 24    unknown primary cancer  . Colon cancer Maternal Grandfather 12  . Breast cancer Maternal Aunt 50    second cancer, 81    SOCIAL HISTORY:  Social History   Social History  . Marital status: Married    Spouse name: N/A  . Number of children: N/A  . Years of education: N/A   Occupational History  . Not on file.   Social History Main Topics  . Smoking status: Never Smoker  . Smokeless tobacco: Never Used  . Alcohol use No  . Drug use: No  . Sexual activity: Not on file   Other Topics Concern  . Not on file   Social History Narrative  . No narrative on file     PHYSICAL  EXAM  Vitals:   08/25/16 1010  BP: (!) 142/93  Pulse: 94  Resp: 16  Weight: 199 lb (90.3 kg)  Height: 5\' 5"  (1.651 m)    Body mass index is 33.12 kg/m.   General: The patient is well-developed and well-nourished and in no acute distress    Neurologic Exam  Mental status: The patient is alert and oriented x 3 at the time of the examination. The patient has apparent normal recent and remote memory, with an apparently normal attention span and concentration ability.   Speech is normal.  Cranial nerves: Extraocular movements are full (not conjugate with alternating exotropia though. Pupils are equal, round, and reactive to light and accomodation.  Facial  symmetry is present. There is good facial sensation to soft touch bilaterally.Facial strength is normal.  Trapezius and sternocleidomastoid strength is normal. No dysarthria is noted.  The tongue is midline, and the patient has symmetric elevation of the soft palate. No obvious hearing deficits are noted.  Motor:  Muscle bulk is normal.   Tone is normal. Strength is  5 / 5 in all 4 extremities.      Sensory: Sensory testing is intact to touch and vibration sensation in all 4 extremities.  Coordination: Cerebellar testing reveals good finger-nose-finger and heel-to-shin bilaterally.  Gait and station: Station is normal.   Gait is normal. Tandem gait is minimally wide. Romberg is negative.   Reflexes: Deep tendon reflexes are symmetric and normal bilaterally.     Other:   Phalen's sign on the left    DIAGNOSTIC DATA (LABS, IMAGING, TESTING) - I reviewed patient records, labs, notes, testing and imaging myself where available.      ASSESSMENT AND PLAN  Multiple sclerosis (HCC) - Plan: CBC with Differential/Platelet, Hepatic function panel, VITAMIN D 25 Hydroxy (Vit-D Deficiency, Fractures)  Low serum vitamin D  Elevated liver function tests  High risk medication use  Attention deficit disorder, unspecified hyperactivity  presence  Class 1 obesity without serious comorbidity with body mass index (BMI) of 30.0 to 30.9 in adult, unspecified obesity type   1.   Continue Rebif.   We will check CBC with Diff and LFT to assess for lymphopenia and hepato-toxicity.   2.   Renew phentermine for weight loss and attention deficit.   His blood pressure is elevated when she checks it at home over the next week, she will do a trial off phentermine to determine if the phentermine is contributing to the elevated blood pressure 3.   Stay active and exercise as tolerated. 4.    She will return to see me in 6 months or sooner if she has new or worsening neurologic symptoms.   Dorothie Wah A. Epimenio Foot, MD, PhD 08/25/2016, 10:39 AM Certified in Neurology, Clinical Neurophysiology, Sleep Medicine, Pain Medicine and Neuroimaging  [facility] [address] [place], [place] [address] [phone]

## 2016-08-26 LAB — HEPATIC FUNCTION PANEL
ALT: 24 IU/L (ref 0–32)
AST: 21 IU/L (ref 0–40)
Albumin: 4.1 g/dL (ref 3.5–5.5)
Alkaline Phosphatase: 127 IU/L — ABNORMAL HIGH (ref 39–117)
BILIRUBIN, DIRECT: 0.12 mg/dL (ref 0.00–0.40)
Bilirubin Total: 0.3 mg/dL (ref 0.0–1.2)
Total Protein: 7.6 g/dL (ref 6.0–8.5)

## 2016-08-26 LAB — CBC WITH DIFFERENTIAL/PLATELET
BASOS: 0 %
Basophils Absolute: 0 10*3/uL (ref 0.0–0.2)
EOS (ABSOLUTE): 0.1 10*3/uL (ref 0.0–0.4)
EOS: 1 %
HEMATOCRIT: 38 % (ref 34.0–46.6)
Hemoglobin: 12.1 g/dL (ref 11.1–15.9)
Immature Grans (Abs): 0 10*3/uL (ref 0.0–0.1)
Immature Granulocytes: 0 %
LYMPHS ABS: 2.1 10*3/uL (ref 0.7–3.1)
Lymphs: 27 %
MCH: 26.1 pg — AB (ref 26.6–33.0)
MCHC: 31.8 g/dL (ref 31.5–35.7)
MCV: 82 fL (ref 79–97)
MONOS ABS: 0.6 10*3/uL (ref 0.1–0.9)
Monocytes: 7 %
NEUTROS ABS: 5.2 10*3/uL (ref 1.4–7.0)
Neutrophils: 65 %
PLATELETS: 330 10*3/uL (ref 150–379)
RBC: 4.64 x10E6/uL (ref 3.77–5.28)
RDW: 15.6 % — AB (ref 12.3–15.4)
WBC: 7.9 10*3/uL (ref 3.4–10.8)

## 2016-08-26 LAB — VITAMIN D 25 HYDROXY (VIT D DEFICIENCY, FRACTURES): VIT D 25 HYDROXY: 28.9 ng/mL — AB (ref 30.0–100.0)

## 2016-08-29 ENCOUNTER — Telehealth: Payer: Self-pay | Admitting: *Deleted

## 2016-08-29 MED ORDER — VITAMIN D (ERGOCALCIFEROL) 1.25 MG (50000 UNIT) PO CAPS: 50000.0000 [IU] | ORAL_CAPSULE | ORAL | 0 refills | Status: DC

## 2016-08-29 NOTE — Telephone Encounter (Signed)
-----   Message from Asa Lente, MD sent at 08/26/2016  2:26 PM EST ----- Please let her know vit D is mildly low --- 50000 U weekly x 12 weeks then 5000 U daily OTC    Other labs were fine

## 2016-08-29 NOTE — Telephone Encounter (Signed)
I have spoken with Denise Franklin this afternoon and reviewed lab results as below.  She verbalized understanding of same, is agreeable with rx. Vit. D.  Rx. faxed to [facility] [address] [place] per her request/fim

## 2016-08-29 NOTE — Addendum Note (Signed)
Addended by: Candis Schatz I on: 08/29/2016 02:02 PM   Modules accepted: Orders

## 2016-08-29 NOTE — Telephone Encounter (Signed)
LMTC./fim 

## 2016-10-03 ENCOUNTER — Telehealth: Payer: Self-pay | Admitting: *Deleted

## 2016-10-03 MED ORDER — INTERFERON BETA-1A 44 MCG/0.5ML ~~LOC~~ SOSY: 44.0000 ug | PREFILLED_SYRINGE | SUBCUTANEOUS | 3 refills | Status: DC

## 2016-10-03 NOTE — Telephone Encounter (Signed)
Rebif escribed to [facility] per faxed request/fim

## 2017-01-20 ENCOUNTER — Other Ambulatory Visit (HOSPITAL_BASED_OUTPATIENT_CLINIC_OR_DEPARTMENT_OTHER): Payer: Self-pay | Admitting: Obstetrics and Gynecology

## 2017-01-20 DIAGNOSIS — Z1231 Encounter for screening mammogram for malignant neoplasm of breast: Secondary | ICD-10-CM

## 2017-02-07 ENCOUNTER — Encounter (HOSPITAL_BASED_OUTPATIENT_CLINIC_OR_DEPARTMENT_OTHER): Payer: Self-pay

## 2017-02-07 ENCOUNTER — Ambulatory Visit (HOSPITAL_BASED_OUTPATIENT_CLINIC_OR_DEPARTMENT_OTHER)
Admission: RE | Admit: 2017-02-07 | Discharge: 2017-02-07 | Disposition: A | Discharge status: Home / Self Care | Payer: 59 | Source: Ambulatory Visit | Attending: Obstetrics and Gynecology | Admitting: Obstetrics and Gynecology

## 2017-02-07 DIAGNOSIS — Z1231 Encounter for screening mammogram for malignant neoplasm of breast: Secondary | ICD-10-CM | POA: Diagnosis present

## 2017-03-06 ENCOUNTER — Ambulatory Visit: Payer: 59 | Admitting: Neurology

## 2017-03-12 ENCOUNTER — Other Ambulatory Visit: Payer: Self-pay | Admitting: Neurology

## 2017-04-17 ENCOUNTER — Encounter: Payer: Self-pay | Admitting: Neurology

## 2017-04-17 ENCOUNTER — Ambulatory Visit (INDEPENDENT_AMBULATORY_CARE_PROVIDER_SITE_OTHER): Payer: 59 | Admitting: Neurology

## 2017-04-17 VITALS — BP 152/99 | HR 83 | Resp 18 | Ht 65.0 in | Wt 203.0 lb

## 2017-04-17 DIAGNOSIS — R7989 Other specified abnormal findings of blood chemistry: Secondary | ICD-10-CM | POA: Diagnosis not present

## 2017-04-17 DIAGNOSIS — Z79899 Other long term (current) drug therapy: Secondary | ICD-10-CM | POA: Diagnosis not present

## 2017-04-17 DIAGNOSIS — G35D Multiple sclerosis, unspecified: Secondary | ICD-10-CM

## 2017-04-17 DIAGNOSIS — R5383 Other fatigue: Secondary | ICD-10-CM

## 2017-04-17 DIAGNOSIS — G4719 Other hypersomnia: Secondary | ICD-10-CM

## 2017-04-17 DIAGNOSIS — R0683 Snoring: Secondary | ICD-10-CM | POA: Diagnosis not present

## 2017-04-17 DIAGNOSIS — R200 Anesthesia of skin: Secondary | ICD-10-CM

## 2017-04-17 DIAGNOSIS — G5603 Carpal tunnel syndrome, bilateral upper limbs: Secondary | ICD-10-CM

## 2017-04-17 DIAGNOSIS — G35 Multiple sclerosis: Secondary | ICD-10-CM | POA: Diagnosis not present

## 2017-04-17 NOTE — Progress Notes (Signed)
[facility]  PATIENT: Denise Franklin. Byland DOB: Aug 26, 1970  REFERRING DOCTOR OR PCP:  Vivien Presto SOURCE: patient  _________________________________   HISTORICAL  CHIEF COMPLAINT:  Chief Complaint  Patient presents with  . Multiple Sclerosis    HISTORY OF PRESENT ILLNESS:  Denise Franklin is a 46 yo woman with MS.     Update 04/17/2017:    She continues on Rebif therapy for her MS. She denies any new exacerbations.  She tolerates the Rebif fairly well. She does note some flulike symptoms at times but this is been greatly helped by taking the Rebif shot at night followed by ibuprofen. She will occasionally have some anxiety that night after the injections and will take a Xanax with benefit. An MRI of the brain 09/21/2015 showed no new lesions compared to her 2014 MRI.      Generally, gait is doing well. She did have 1 fall while hiking but it was on an uneven surface when she turned her head. She does though sometimes notes slight decreased balance. She denies any weakness. She does have numbness and tingling in the left arm but was diagnosed with carpal tunnel syndrome in the past. Typically the numbness will come and go. This occurs when she bends her arm like shampooing her hair.   Vision is fine.   Bladder is fine.     She has fatigue and sleepiness. Phentermine has not really helped the fatigue much. It has not helped any weight loss. She has been noted to snore at night. She scored a 13/24 on the Epworth Sleepiness Scale.Marland Kitchen  Her husband has not told her that she has pauses in her breathing or snoring or gasping. Denies definite depression or anxiety but does note a little bit more irritability, especially of the kids, lately.    She denies any problems with cognition.   EPWORTH SLEEPINESS SCALE  On a scale of 0 - 3 what is the chance of dozing:  Sitting and Reading:   2 Watching TV:    2 Sitting inactive in a public place: 3 Passenger in car for one  hour: 3 Lying down to rest in the afternoon: 2 Sitting and talking to someone: 0 Sitting quietly after lunch:  0 In a car, stopped in traffic:  1  Total (out of 24):   13/24   (mild excessive daytime sleepiness)    _______________________________ From 08/25/2016 MS:   She is on Rebif and tolerates it well.   She has no new exacerbations.   MRI 09/2015 was unchanged to a 2014 MRI.   Symptoms are about the same.   .     Gait/strength/sensation: She notes no major problems with gait but feels off balanced at times.   No falls.   She denies any weakness but tires out more easily.   She reports an occasional L > R painful tingling in her hands in the morning.   It is in the palm and dorsum.  Shaking helps resolve it.  Many years ago, she was diagnosed with carpal tunnel syndrome.     Bladder: She denies any difficulty with bladder frequency or hesitancy. She had a urinary tract infection this week which is unusual and she is also being evaluated for possible ovarian cyst.     Vision: She denies any MS related vision disorder.  No diplopia but she has a blurriness at times.   She has had alternating exotropia since childhood.  She sees ophtho annually.  Fatigue/sleep: She  has fatigue some days, especially later in the day. Phentermine has helped hte fatigue  Sleep is good with 7+ hours most nights.  She has less daytime sleepiness on Phentermine.       She snores but has never had gasping or pauses noted (she has asked husband).     Mood/attention/cognition: She feels mood is ok.   She denies depression. .She has rare panic type spells x years (only one a month or less).   Xanax helps when she has one. She denies any significant problem with cognition but attention is poor nd she is very distractible.  Also, she has word finding difficulties at times.   She is able to work as a part time [employer].    Weight:  She has loss 18 pounds since starting Phentermine  MS History:   She presented with  left arm dysesthesias that spread to the face in June 2012. She then had an MRI of the brain that was consistent with multiple sclerosis and she was diagnosed. She started on Rebif in September 2012. She continues on Rebif and generally has tolerated it well. She has had some skin reactions but they have not been severe. Since starting Rebif she has not had any more exacerbations. Follow-up MRI in 2013 and 2014 both showed that there was a stable pattern of demyelinating foci. There were no new lesions.   REVIEW OF SYSTEMS: Constitutional: No fevers, chills, sweats, or change in appetite.  Notes  fatigue Eyes: No visual changes, double vision, eye pain Ear, nose and throat: No hearing loss, ear pain, nasal congestion, sore throat Cardiovascular: No chest pain, palpitations Respiratory: No shortness of breath at rest or with exertion.   No wheezes GastrointestinaI: No nausea, vomiting, diarrhea, abdominal pain, fecal incontinence Genitourinary: No dysuria, urinary retention or frequency.  No nocturia. Musculoskeletal: No neck pain, back pain Integumentary: No rash, pruritus, skin lesions Neurological: as above Psychiatric: No depression at this time.  Some anxiety Endocrine: No palpitations, diaphoresis, change in appetite, change in weigh or increased thirst Hematologic/Lymphatic: No anemia, purpura, petechiae. Allergic/Immunologic: No itchy/runny eyes, nasal congestion, recent allergic reactions, rashes  ALLERGIES: No Known Allergies  HOME MEDICATIONS:  Current Outpatient Prescriptions:  .  ALPRAZolam (XANAX) 0.5 MG tablet, Use as needed for panic attack, Disp: 20 tablet, Rfl: 0 .  cholecalciferol (VITAMIN D) 1000 UNITS tablet, Take 2,000 Units by mouth daily. Reported on 08/18/2015, Disp: , Rfl:  .  ibuprofen (ADVIL,MOTRIN) 400 MG tablet, Take 400 mg by mouth., Disp: , Rfl:  .  interferon beta-1a (REBIF) 44 MCG/0.5ML SOSY injection, Inject 0.5 mLs (44 mcg total) into the skin 3  (three) times a week., Disp: 36 Syringe, Rfl: 3 .  Multiple Vitamin (MULTIVITAMIN) tablet, Take 1 tablet by mouth daily., Disp: , Rfl:  .  Omega-3 Fatty Acids (FISH OIL) 1000 MG CAPS, Take 1,000 each by mouth daily., Disp: , Rfl:  .  phentermine 37.5 MG capsule, TAKE ONE CAPSULE BY MOUTH IN THE MORNING, Disp: 30 capsule, Rfl: 5  PAST MEDICAL HISTORY: Past Medical History:  Diagnosis Date  . Infertility, female   . Multiple sclerosis (HCC)   . Vision abnormalities     PAST SURGICAL HISTORY: Past Surgical History:  Procedure Laterality Date  . EYE SURGERY Right   . LAPAROSCOPIC APPENDECTOMY      FAMILY HISTORY: Family History  Problem Relation Age of Onset  . Breast cancer Mother 39       Stage IV  . Kidney failure  Father   . Diabetes type II Father   . Breast cancer Sister 89       DCIS  . Melanoma Maternal Aunt 51       mets to brain, died at 32  . Lung cancer Maternal Uncle 23       smoker  . Brain cancer Maternal Grandmother 76       unknown primary cancer  . Colon cancer Maternal Grandfather 33  . Breast cancer Maternal Aunt 50       second cancer, 84    SOCIAL HISTORY:  Social History   Social History  . Marital status: Married    Spouse name: N/A  . Number of children: N/A  . Years of education: N/A   Occupational History  . Not on file.   Social History Main Topics  . Smoking status: Never Smoker  . Smokeless tobacco: Never Used  . Alcohol use No  . Drug use: No  . Sexual activity: Not on file   Other Topics Concern  . Not on file   Social History Narrative  . No narrative on file     PHYSICAL EXAM  Vitals:   04/17/17 0958  BP: (!) 152/99  Pulse: 83  Resp: 18  Weight: 203 lb (92.1 kg)  Height:  (1.651 m)    Body mass index is 33.78 kg/m.   General: The patient is well-developed and well-nourished and in no acute distress    Neurologic Exam  Mental status: The patient is alert and oriented x 3 at the time of the  examination. The patient has apparent normal recent and remote memory, with an apparently normal attention span and concentration ability.   Speech is normal.  Cranial nerves: She has a left greater than right alternating exotropia. Pupils are equal, round and reactive to light and accommodation. Facial strength and sensation is normal. Trapezius strength is normal.  The tongue is midline, and the patient has symmetric elevation of the soft palate. No obvious hearing deficits are noted.  Motor:  Muscle bulk is normal.   Tone is normal. Strength is  5 / 5 in all 4 extremities except 4+/5 left APB strength.  Sensory: Sensory testing is intact to touch and vibration in the arms and legs.  Coordination: Cerebellar testing reveals good finger-nose-finger and heel-to-shin bilaterally.  Gait and station: Station is normal.   Gait is normal. The tandem gait is mildly wide. The Romberg is negative.  Reflexes: Deep tendon reflexes are symmetric and normal bilaterally.     Other:   Phalen's sign on the left    DIAGNOSTIC DATA (LABS, IMAGING, TESTING) - I reviewed patient records, labs, notes, testing and imaging myself where available.      ASSESSMENT AND PLAN  Multiple sclerosis (HCC) - Plan: CBC with Differential/Platelet, Hepatic function panel  Snoring - Plan: Split night study  Excessive daytime sleepiness - Plan: Split night study  Bilateral carpal tunnel syndrome  Numbness  High risk medication use - Plan: CBC with Differential/Platelet, Hepatic function panel  Other fatigue  Low serum vitamin D   1.   Continue Rebif.   We will check CBC with Diff and LFT to assess for lymphopenia and hepato-toxicity.   2.   PSG split night for suspected OSA 3.   Stay active and exercise as tolerated. 4.   If left hand worsens, consider NCV/EMG and/or referral to a surgeon for CTS. She will return to see me in 6 months or sooner  if she has new or worsening neurologic symptoms.   Shavar Gorka  A. Epimenio Foot, MD, PhD 04/17/2017, 10:45 AM Certified in Neurology, Clinical Neurophysiology, Sleep Medicine, Pain Medicine and Neuroimaging  [facility] [address] [place], [place] [address] [phone]

## 2017-04-18 LAB — CBC WITH DIFFERENTIAL/PLATELET
BASOS: 0 %
Basophils Absolute: 0 10*3/uL (ref 0.0–0.2)
EOS (ABSOLUTE): 0.1 10*3/uL (ref 0.0–0.4)
EOS: 1 %
HEMATOCRIT: 38 % (ref 34.0–46.6)
Hemoglobin: 12.5 g/dL (ref 11.1–15.9)
IMMATURE GRANS (ABS): 0 10*3/uL (ref 0.0–0.1)
IMMATURE GRANULOCYTES: 0 %
LYMPHS: 30 %
Lymphocytes Absolute: 2.4 10*3/uL (ref 0.7–3.1)
MCH: 26.6 pg (ref 26.6–33.0)
MCHC: 32.9 g/dL (ref 31.5–35.7)
MCV: 81 fL (ref 79–97)
MONOCYTES: 4 %
MONOS ABS: 0.4 10*3/uL (ref 0.1–0.9)
NEUTROS PCT: 65 %
Neutrophils Absolute: 5.2 10*3/uL (ref 1.4–7.0)
Platelets: 320 10*3/uL (ref 150–379)
RBC: 4.7 x10E6/uL (ref 3.77–5.28)
RDW: 14.9 % (ref 12.3–15.4)
WBC: 8.1 10*3/uL (ref 3.4–10.8)

## 2017-04-18 LAB — HEPATIC FUNCTION PANEL
ALBUMIN: 4.3 g/dL (ref 3.5–5.5)
ALT: 15 IU/L (ref 0–32)
AST: 16 IU/L (ref 0–40)
Alkaline Phosphatase: 123 IU/L — ABNORMAL HIGH (ref 39–117)
BILIRUBIN TOTAL: 0.5 mg/dL (ref 0.0–1.2)
BILIRUBIN, DIRECT: 0.13 mg/dL (ref 0.00–0.40)
Total Protein: 7.5 g/dL (ref 6.0–8.5)

## 2017-04-19 ENCOUNTER — Telehealth: Payer: Self-pay | Admitting: *Deleted

## 2017-04-19 NOTE — Telephone Encounter (Signed)
-----   Message from Asa Lente, MD sent at 04/18/2017 10:08 AM EDT ----- Please let the patient know that the lab work is fine.

## 2017-04-19 NOTE — Telephone Encounter (Signed)
LMOM (identified vm) that per RAS, lab work done in our office is fine.  She does not need to return this call unless she has questions/fim 

## 2017-04-19 NOTE — Telephone Encounter (Signed)
-----   Message from Richard A Sater, MD sent at 04/18/2017 10:08 AM EDT ----- Please let the patient know that the lab work is fine.  

## 2017-07-19 ENCOUNTER — Telehealth: Payer: Self-pay

## 2017-07-19 NOTE — Telephone Encounter (Signed)
LVM to reschedule cancelled sleep study appt

## 2017-07-19 NOTE — Telephone Encounter (Signed)
error 

## 2017-07-25 ENCOUNTER — Telehealth: Payer: Self-pay | Admitting: *Deleted

## 2017-07-25 NOTE — Telephone Encounter (Signed)
-----   Message from Trevor Mace sent at 07/19/2017  4:35 PM EST ----- Regarding: Just a heads up Pt cancelled sleep study appt that was scheduled in early December. Rescheduled pt for 10/03/17. Didn't know if that messed up the f/u appt with Dr Epimenio Foot on 4/3.

## 2017-07-25 NOTE — Telephone Encounter (Signed)
LMOM (identified vm) to call and r/s her appt. with RAS from April to the end of May.  Needs to be about 60 days after her sleep study.  If she calls back, can you please change this appt. for her?  Thank you!

## 2017-07-31 NOTE — Telephone Encounter (Signed)
Noted/fim 

## 2017-07-31 NOTE — Telephone Encounter (Signed)
Pt returned RN's call. Appt has moved to 5/29 @ 11:30 with Dr Epimenio Foot

## 2017-08-30 ENCOUNTER — Telehealth: Payer: Self-pay | Admitting: *Deleted

## 2017-08-30 MED ORDER — INTERFERON BETA-1A 44 MCG/0.5ML ~~LOC~~ SOSY: 44.0000 ug | PREFILLED_SYRINGE | SUBCUTANEOUS | 3 refills | Status: DC

## 2017-08-30 NOTE — Telephone Encounter (Signed)
Rebif escribed to [facility] per faxed request from them/fim

## 2017-10-03 ENCOUNTER — Telehealth: Payer: Self-pay | Admitting: Neurology

## 2017-10-03 NOTE — Telephone Encounter (Signed)
Pt called reg an upcoming appt that might need to be r/s and the sleep study being scheduled.

## 2017-10-03 NOTE — Telephone Encounter (Signed)
Spoke with Denise Franklin.  Her 10/03/17 S/N study was r/s to 4/16.  This will not change her anticipated compliance visit with RAS on 12/13/17.  She verbalized understanding of same/fim

## 2017-10-18 ENCOUNTER — Ambulatory Visit: Payer: 59 | Admitting: Neurology

## 2017-10-31 ENCOUNTER — Ambulatory Visit (INDEPENDENT_AMBULATORY_CARE_PROVIDER_SITE_OTHER): Payer: 59 | Admitting: Neurology

## 2017-10-31 DIAGNOSIS — R0683 Snoring: Secondary | ICD-10-CM

## 2017-10-31 DIAGNOSIS — G4719 Other hypersomnia: Secondary | ICD-10-CM

## 2017-10-31 DIAGNOSIS — G4710 Hypersomnia, unspecified: Secondary | ICD-10-CM

## 2017-11-02 ENCOUNTER — Telehealth: Payer: Self-pay | Admitting: *Deleted

## 2017-11-02 NOTE — Telephone Encounter (Signed)
Spoke to patient - she is aware of her normal sleep study results. She will keep her pending appointment on 12/13/17 for further discussion of symptoms.

## 2017-11-02 NOTE — Progress Notes (Signed)
PATIENT'S NAME:  Liesa, Tsan  DOB:      Dec 14, 1970      MR#:    [id number]     DATE OF RECORDING: 10/31/2017 REFERRING M.D.:  Dawn Oda Cogan PA-C Study Performed:   Baseline Polysomnogram HISTORY:  She is a 47 yo woman with MS reporting snoring, daytime sleepiness and fatigue.   The Epworth score is 13/24.    BMI Is 33.78  CURRENT MEDICATIONS:  Rebif, Apprazolam prn, phentermine  PROCEDURE:  This is a multichannel digital polysomnogram utilizing the Somnostar 11.2 system.  Electrodes and sensors were applied and monitored per AASM Specifications.   EEG, EOG, Chin and Limb EMG, were sampled at 200 Hz.  ECG, Snore and Nasal Pressure, Thermal Airflow, Respiratory Effort, CPAP Flow and Pressure, Oximetry was sampled at 50 Hz. Digital video and audio were recorded.      BASELINE STUDY  Lights Out was at 22:53 and Lights On at 04:53.  Total recording time (TRT) was 360.5 minutes, with a total sleep time (TST) of  248.5 minutes.   The patient's sleep latency was 11.5 minutes.  REM latency was 59 minutes.  The sleep efficiency was 68.9 %.     SLEEP ARCHITECTURE: WASO (Wake after sleep onset) was 54 minutes.  There were 21 minutes in Stage N1, 88 minutes Stage N2, 90 minutes Stage N3 and 49.5 minutes in Stage REM.  The percentage of Stage N1 was 8.5%, Stage N2 was 35.4%, Stage N3 was 36.2% and Stage R (REM sleep) was 19.9%.   The arousals were noted as: 28 were spontaneous, 4 were associated with PLMs, 6 were associated with respiratory events.   Audio and video analysis did not show any abnormal or unusual movements, behaviors, phonations or vocalizations.   The patient took one bathroom breaks. Snoring was noted.   EKG was in keeping with normal sinus rhythm (NSR).  RESPIRATORY ANALYSIS:  There were a total of 9 respiratory events:  0 obstructive apneas, 0 central apneas and 2 mixed apneas with a total of 2 apneas and an apnea index (AI) of .5 /hour. There were 7 hypopneas with a  hypopnea index of 1.7 /hour. The patient also had 0 respiratory event related arousals (RERAs).      The total APNEA/HYPOPNEA INDEX (AHI) was 2.20./hour and the total RESPIRATORY DISTURBANCE INDEX was 2.2 /hour.  1 events occurred in REM sleep and 15 events in NREM. The REM AHI was 1.20. /hour, versus a non-REM AHI of 2.4. The patient spent 90 minutes of total sleep time in the supine position and 159 minutes in non-supine.. The supine AHI was 3.3 versus a non-supine AHI of 1.6.  OXYGEN SATURATION & C02:  The Wake baseline 02 saturation was 97%, with the lowest being 90%. Time spent below 89% saturation equaled 0 minutes.  PERIODIC LIMB MOVEMENTS:   The patient had a total of 19 Periodic Limb Movements.  The Periodic Limb Movement (PLM) index was 4.6 and the PLM Arousal index was 1./hour.   IMPRESSION:  1. She had snoring but no significant OSA (AHI = 2.2)  2. She had negligible PLMS and no impact on sleep 3. Reduced sleep efficiency, mostly due to early morning awakening  RECOMMENDATIONS:  1. No treatment is necessary.  If the snoring is troublesome, consider an oral appliance. 2. If insomnia is typical, consider an agent to help sleep maintenance.   I certify that I have reviewed the entire raw data recording prior to the issuance of  this report in accordance with the Standards of Accreditation of the American Academy of Sleep Medicine (AASM)     Shearon Balo, PhD Diplomat, American Board of Psychiatry and Neurology, Sleep Medicine     Demographics and Medical History           Name: Ayana, Imhof  Age: 42 BMI: 33.78 Interp Physician: Despina Arias, MD  DOB: 1970-10-18 Ht-IN: 65 CM: --- Referred By: Donia Pounds PA-C  Pt. Tag: --- Wt-LB: 203 KG: --- Tested By: Vivia Budge, RPSGT  Pt. #: [id number] Sex: Female Scored By: Vivia Budge, RPSGT  Bed Tag: [id number] Race: Caucasian Occupation: ---  Indication for PS: ---   Sleep Summary    Sleep Time Statistics  Minutes Hours    Time in Bed 360.5    6.0    Total Sleep Time 248.5    4.1    Total Sleep Time NREM 199    3.3    Total Sleep Time REM 49.5    0.8    Sleep Onset 11.5    0.2    Wake After Sleep Onset 54    0.9    Wake After Sleep Period 46.5    0.8    Latency Persistent Sleep 11.5    0.2    Sleep Efficiency 68.9 Percent    Lights out 22:53     Lights on 04:53    Sleep Disruption Events Count Index    Arousals 50 12.1    Awakenings 0 0    Arousals + Awakenings 50 12.1    REM Awakenings 0 0.0     Sleep Stage Statistics Wake N1 N2 N3 REM    Percent Stage to SPT 17.9  6.9  29.1  29.8  16.4  Percent   Sleep Period Time in Stage 54 21 88 90 49.5 Minutes   Latency to Stage  11.5 12 5.5 59 Minutes   Percent Stage to TST  8.5 35.4 36.2 19.9 Percent   EKG Summary          EKG Statistics         Heart Rate, Wake 80 BPM  TST Epochs in HR Interval 0 < 29   Heart Rate, Steady Sleep Avg 75 BPM   0 30-59   PAC Events 0 Count   416 60-79   PVC Events 0 Count   81 80-99   Bradycardia 0 Count   0 100-119   Tachycardia 0 Count   0 120-139        0 140-159    NREM REM   0 > 160   Shortest R-R .6 .7       Longest R-R .9 .9        Respiration Summary  Event Statistics Total  With Arousal  With Awakening    Count Index  Count Index  Count Index   Apneas, Total 2 .5  0 0.0   0 0.0    Hypopneas, Total 7 1.7  6 1.4   0 0.0    Apnea + Hypopnea Index 9 2.2   6 1.4   0 0.0    Apneas, Supine 0 0     Apneas, Non Supine 2 .8     Hypopneas, Supine 5 3.3     Hypopneas, Non Supine 2 .8     % Sleep Apnea .2 Percent     % Sleep Hypopnea .7 Percent    Oximetry Statistics       SpO2, Mean  Wake 97 Percent     SpO2, Minimum 90 Percent     SpO2, Max 98 Percent     SpO2, Mean 94 Percent            Desaturation Index, REM 2.4  Index     Desaturation Index, NREM 1.2  Index     Desaturation Index, Total 1.4  Index             SpO2 Intervals > 89% 80-89% 70-79% 60-69% 50-59% 40-49%  30-39% < 30%  248.5 Percent Sleep Time 100 0 0 0 0 0 0 0  Body Position Statistics   Back Side L Side R Side Prone    Total Sleep Time   90 158.5 75 83.5 0 Minutes   Percent Time to TST   36.2  63.8  30.2  33.6  0.0  Percent   Number of Events   5 4.0 4 0 0 Count   Number of Apneas   0 2 2 0 0 Count   Number of Hypopneas   5 2 2  0 0 Count   Apnea Index   0.0  0.8  1.6  0.0  0.0  Index   Hypopnea Index   3.3  0.8  1.6  0.0  0.0  Index   Apnea + Hypopnea Index   3.3  1.5  3.2  0.0  0.0  Index  Respiration Events    Non REM, Pre Rx Statistics Non Supine  Supine    Central Mixed Obstr  Central Mixed Obstr   Apneas 0 1 0  0 0 0 Count  Apneas, Minimum SpO2 0 92 0  0 0 0 Percent     Hypopneas 0 0 2  0 0 5 Count  Hypopneas, Minimum SpO2 0 0 90  0 0 91 Percent     Apnea + Hypopneas Index 0.0 0.6 1.1  0.0 0.0 3.3 Index    REM, Pre Rx Statistics Non Supine  Supine    Central Mixed Obstr  Central Mixed Obstr   Apneas 0 1 0  0 0 0 Count  Apneas, Minimum SpO2 0 91 0  0 0 0 Percent     Hypopneas 0 0 0  0 0 0 Count  Hypopneas, Minimum SpO2 0 0 0  0 0 0 Percent     Apnea + Hypopnea Index 0.0 1.2 0.0  0.0 0.0 0.0 Index  Leg Movement Summary    PLM Non REM (Incl. Wake) REM Total    No Arousal Arousal Wake No Arousal Arousal Wake No Arousal Arousal Wake Total   Isolated 2 11 0 2 1 0 4 12 0 16    PLMS 15 4 0 0 0 0 15 4 0 19    Total 17 15 0 2 1 0 19 16 0 35   PLM Statistics PLMS Total     Count Index Count Index    PLM 19 4.6 35 8.5     PLM with Arousal 4 1. 16 3.9     PLM, with Wake 0 0 0 0.0     PLM, Arousal + Wake 4 1.0 16 3.9     PLM, No Arousal 15 3.6  19 4.6     PLM, Non REM 19 5.7  32 9.6     PLM, REM 0 0.0  3 3.6     Technician Comments:  Pt scored per 3% hypopnea rule.  Rare respiratory events noted.  Mild PLMs seen.  NSR observed in EKG.  Pt awoke during study with sore legs and needed to walk around.  Pt slept in supine and lateral positions.  Pt  observed in sleep stages N1, N2, N3, REM.  Moderately loud snoring was heard.  Nocturia x1.

## 2017-11-07 ENCOUNTER — Other Ambulatory Visit: Payer: Self-pay | Admitting: Neurology

## 2017-12-13 ENCOUNTER — Other Ambulatory Visit: Payer: Self-pay

## 2017-12-13 ENCOUNTER — Ambulatory Visit: Payer: 59 | Admitting: Neurology

## 2017-12-13 ENCOUNTER — Encounter: Payer: Self-pay | Admitting: Neurology

## 2017-12-13 VITALS — BP 143/97 | HR 89 | Resp 18 | Ht 65.0 in | Wt 195.5 lb

## 2017-12-13 DIAGNOSIS — G4719 Other hypersomnia: Secondary | ICD-10-CM | POA: Diagnosis not present

## 2017-12-13 DIAGNOSIS — G5603 Carpal tunnel syndrome, bilateral upper limbs: Secondary | ICD-10-CM

## 2017-12-13 DIAGNOSIS — G35D Multiple sclerosis, unspecified: Secondary | ICD-10-CM

## 2017-12-13 DIAGNOSIS — R5383 Other fatigue: Secondary | ICD-10-CM

## 2017-12-13 DIAGNOSIS — G35 Multiple sclerosis: Secondary | ICD-10-CM

## 2017-12-13 DIAGNOSIS — Z79899 Other long term (current) drug therapy: Secondary | ICD-10-CM | POA: Diagnosis not present

## 2017-12-13 NOTE — Progress Notes (Signed)
[facility]  PATIENT: Denise Franklin DOB: August 03, 1970  REFERRING DOCTOR OR PCP:  Vivien Presto SOURCE: patient  _________________________________   HISTORICAL  CHIEF COMPLAINT:  Chief Complaint  Patient presents with  . Multiple Sclerosis    Sts. she continues to tolerate Rebif well.  Would like to discuss PSG results.  Still has trouble staying asleep--sts. she tosses and turns all night, constantly adjusting her pillow, etc. Feels more fatigued and more easily agitated. Not sure Phentermine helps. Has lost 8 lbs since last ov/fim    HISTORY OF PRESENT ILLNESS:  Denise Franklin is a 47 yo woman with MS.     Update 12/13/2017: She feels it has been stable she denies any exacerbations.  She is on Rebif and she tolerates it well.   Gait does well she can climb stairs without difficulty.  She denies any weakness.  There is sometimes some numbness in the left arm that comes and goes.  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  She reports fatigue and daytime sleepiness.  She has snoring and excessive daytime sleepiness and underwent a recent polysomnogram.   It showed snoring but no significant OSA.  The AHI was only 2.2.  There was negligible periodic limb movements of sleep that did not impact the quality of her sleep.  Mood is usually good though she sometimes has some irritability with her kids.  There is no difficulty with cognition.   We tried phentermine and it seemed to help her some last year but not this year.     On a typical night, she goes to bed at 11 to 12 and she falls asleep easily most nights but not every night (occ 2-3 am)   She wakes up at 6 am regardless of sleep onset.    She is still getting some numbness in her hands.  This is transient and often occurs when she is holding a phone.  The left hand has more of these numbness episodes than the right.  She does not have any weakness in  the hands.  There is no fixed numbness.   Update 04/17/2017:    She continues on Rebif therapy for her MS. She denies any new exacerbations.  She tolerates the Rebif fairly well. She does note some flulike symptoms at times but this is been greatly helped by taking the Rebif shot at night followed by ibuprofen. She will occasionally have some anxiety that night after the injections and will take a Xanax with benefit. An MRI of the brain 09/21/2015 showed no new lesions compared to her 2014 MRI.      Generally, gait is doing well. She did have 1 fall while hiking but it was on an uneven surface when she turned her head. She does though sometimes notes slight decreased balance. She denies any weakness. She does have numbness and tingling in the left arm but was diagnosed with carpal tunnel syndrome in the past. Typically the numbness will come and go. This occurs when she bends her arm like shampooing her hair.   Vision is fine.   Bladder is fine.     She has fatigue and sleepiness. Phentermine has not really helped the fatigue much. It has not helped any weight loss. She has been noted to snore at night. She scored a 13/24 on the Epworth Sleepiness Scale.Marland Kitchen  Her husband has not told her that she has pauses in her breathing or snoring  or gasping. Denies definite depression or anxiety but does note a little bit more irritability, especially of the kids, lately.    She denies any problems with cognition.   EPWORTH SLEEPINESS SCALE  On a scale of 0 - 3 what is the chance of dozing:  Sitting and Reading:   2 Watching TV:    2 Sitting inactive in a public place: 3 Passenger in car for one hour: 3 Lying down to rest in the afternoon: 2 Sitting and talking to someone: 0 Sitting quietly after lunch:  0 In a car, stopped in traffic:  1  Total (out of 24):   13/24   (mild excessive daytime sleepiness)    _______________________________ From 08/25/2016 MS:   She is on Rebif and tolerates it well.   She  has no new exacerbations.   MRI 09/2015 was unchanged to a 2014 MRI.   Symptoms are about the same.   .     Gait/strength/sensation: She notes no major problems with gait but feels off balanced at times.   No falls.   She denies any weakness but tires out more easily.   She reports an occasional L > R painful tingling in her hands in the morning.   It is in the palm and dorsum.  Shaking helps resolve it.  Many years ago, she was diagnosed with carpal tunnel syndrome.     Bladder: She denies any difficulty with bladder frequency or hesitancy. She had a urinary tract infection this week which is unusual and she is also being evaluated for possible ovarian cyst.     Vision: She denies any MS related vision disorder.  No diplopia but she has a blurriness at times.   She has had alternating exotropia since childhood.  She sees ophtho annually.  Fatigue/sleep: She has fatigue some days, especially later in the day. Phentermine has helped hte fatigue  Sleep is good with 7+ hours most nights.  She has less daytime sleepiness on Phentermine.       She snores but has never had gasping or pauses noted (she has asked husband).     Mood/attention/cognition: She feels mood is ok.   She denies depression. .She has rare panic type spells x years (only one a month or less).   Xanax helps when she has one. She denies any significant problem with cognition but attention is poor nd she is very distractible.  Also, she has word finding difficulties at times.   She is able to work as a part time [employer].    Weight:  She has loss 18 pounds since starting Phentermine  MS History:   She presented with left arm dysesthesias that spread to the face in June 2012. She then had an MRI of the brain that was consistent with multiple sclerosis and she was diagnosed. She started on Rebif in September 2012. She continues on Rebif and generally has tolerated it well. She has had some skin reactions but they have not been severe. Since  starting Rebif she has not had any more exacerbations. Follow-up MRI in 2013 and 2014 both showed that there was a stable pattern of demyelinating foci. There were no new lesions.   REVIEW OF SYSTEMS: Constitutional: No fevers, chills, sweats, or change in appetite.  Notes  fatigue Eyes: No visual changes, double vision, eye pain Ear, nose and throat: No hearing loss, ear pain, nasal congestion, sore throat Cardiovascular: No chest pain, palpitations Respiratory: No shortness of breath at rest or  with exertion.   No wheezes GastrointestinaI: No nausea, vomiting, diarrhea, abdominal pain, fecal incontinence Genitourinary: No dysuria, urinary retention or frequency.  No nocturia. Musculoskeletal: No neck pain, back pain Integumentary: No rash, pruritus, skin lesions Neurological: as above Psychiatric: No depression at this time.  Some anxiety Endocrine: No palpitations, diaphoresis, change in appetite, change in weigh or increased thirst Hematologic/Lymphatic: No anemia, purpura, petechiae. Allergic/Immunologic: No itchy/runny eyes, nasal congestion, recent allergic reactions, rashes  ALLERGIES: No Known Allergies  HOME MEDICATIONS:  Current Outpatient Medications:  .  ALPRAZolam (XANAX) 0.5 MG tablet, Use as needed for panic attack, Disp: 20 tablet, Rfl: 0 .  cholecalciferol (VITAMIN D) 1000 UNITS tablet, Take 2,000 Units by mouth daily. Reported on 08/18/2015, Disp: , Rfl:  .  ibuprofen (ADVIL,MOTRIN) 400 MG tablet, Take 400 mg by mouth., Disp: , Rfl:  .  interferon beta-1a (REBIF) 44 MCG/0.5ML SOSY injection, Inject 0.5 mLs (44 mcg total) into the skin 3 (three) times a week., Disp: 36 Syringe, Rfl: 3 .  Multiple Vitamin (MULTIVITAMIN) tablet, Take 1 tablet by mouth daily., Disp: , Rfl:  .  Omega-3 Fatty Acids (FISH OIL) 1000 MG CAPS, Take 1,000 each by mouth daily., Disp: , Rfl:  .  phentermine 37.5 MG capsule, TAKE 1 CAPSULE BY MOUTH EVERY MORNING, Disp: 30 capsule, Rfl: 5  PAST  MEDICAL HISTORY: Past Medical History:  Diagnosis Date  . Infertility, female   . Multiple sclerosis (HCC)   . Vision abnormalities     PAST SURGICAL HISTORY: Past Surgical History:  Procedure Laterality Date  . EYE SURGERY Right   . LAPAROSCOPIC APPENDECTOMY      FAMILY HISTORY: Family History  Problem Relation Age of Onset  . Breast cancer Mother 24       Stage IV  . Kidney failure Father   . Diabetes type II Father   . Breast cancer Sister 22       DCIS  . Melanoma Maternal Aunt 51       mets to brain, died at 58  . Lung cancer Maternal Uncle 37       smoker  . Brain cancer Maternal Grandmother 3       unknown primary cancer  . Colon cancer Maternal Grandfather 92  . Breast cancer Maternal Aunt 50       second cancer, 40    SOCIAL HISTORY:  Social History   Socioeconomic History  . Marital status: Married    Spouse name: Not on file  . Number of children: Not on file  . Years of education: Not on file  . Highest education level: Not on file  Occupational History  . Not on file  Social Needs  . Financial resource strain: Not on file  . Food insecurity:    Worry: Not on file    Inability: Not on file  . Transportation needs:    Medical: Not on file    Non-medical: Not on file  Tobacco Use  . Smoking status: Never Smoker  . Smokeless tobacco: Never Used  Substance and Sexual Activity  . Alcohol use: No    Alcohol/week: 0.0 oz  . Drug use: No  . Sexual activity: Not on file  Lifestyle  . Physical activity:    Days per week: Not on file    Minutes per session: Not on file  . Stress: Not on file  Relationships  . Social connections:    Talks on phone: Not on file    Gets together:  Not on file    Attends religious service: Not on file    Active member of club or organization: Not on file    Attends meetings of clubs or organizations: Not on file    Relationship status: Not on file  . Intimate partner violence:    Fear of current or ex partner:  Not on file    Emotionally abused: Not on file    Physically abused: Not on file    Forced sexual activity: Not on file  Other Topics Concern  . Not on file  Social History Narrative  . Not on file     PHYSICAL EXAM  Vitals:   12/13/17 1125  BP: (!) 143/97  Pulse: 89  Resp: 18  Weight: 195 lb 8 oz (88.7 kg)  Height: 5\' 5"  (1.651 m)    Body mass index is 32.53 kg/m.   General: The patient is well-developed and well-nourished and in no acute distress    Neurologic Exam  Mental status: The patient is alert and oriented x 3 at the time of the examination. The patient has apparent normal recent and remote memory, with an apparently normal attention span and concentration ability.   Speech is normal.  Cranial nerves: She has a left greater than right alternating exotropia. Pupils are equal, round and reactive to light and accommodation. Facial strength and sensation is normal. Trapezius strength is normal.  The tongue is midline, and the patient has symmetric elevation of the soft palate. No obvious hearing deficits are noted.  Motor:  Muscle bulk is normal.   Tone is normal. Strength is  5 / 5 in all 4 extremities except 4+/5 left APB strength.  Sensory: Sensory testing is intact to touch and vibration in the arms and legs.  Coordination: Cerebellar testing reveals good finger-nose-finger and heel-to-shin bilaterally.  Gait and station: Station is normal.   Gait is normal. The tandem gait is mildly wide. The Romberg is negative.  Reflexes: Deep tendon reflexes are symmetric and normal bilaterally.     Other:   Phalen's sign on the left    DIAGNOSTIC DATA (LABS, IMAGING, TESTING) - I reviewed patient records, labs, notes, testing and imaging myself where available.      ASSESSMENT AND PLAN  Multiple sclerosis (HCC)  Other fatigue  Excessive daytime sleepiness  High risk medication use   1.   Continue Rebif.   She sees her primary care soon so we will get  blood work at that time 2.  We spent a lot of time discussing her sleep issues and sleep hygiene.  She will add melatonin 3.   Stay active and exercise as tolerated. 4.   If hand worsens, consider NCV/EMG and/or referral to a surgeon for CTS. She will return to see me in 6 months or sooner if she has new or worsening neurologic symptoms.   40-minute face-to-face evaluation with greater than one half the time counseling or coordinating care about her MS and sleep issues  Perrin Eddleman A. Epimenio Foot, MD, PhD 12/13/2017, 12:05 PM Certified in Neurology, Clinical Neurophysiology, Sleep Medicine, Pain Medicine and Neuroimaging  [facility] [address], [address] [place], [place] [address] [phone]

## 2017-12-13 NOTE — Patient Instructions (Signed)

## 2018-03-13 ENCOUNTER — Other Ambulatory Visit (HOSPITAL_BASED_OUTPATIENT_CLINIC_OR_DEPARTMENT_OTHER): Payer: Self-pay | Admitting: Obstetrics and Gynecology

## 2018-03-13 DIAGNOSIS — Z1231 Encounter for screening mammogram for malignant neoplasm of breast: Secondary | ICD-10-CM

## 2018-03-15 ENCOUNTER — Ambulatory Visit (HOSPITAL_BASED_OUTPATIENT_CLINIC_OR_DEPARTMENT_OTHER)
Admission: RE | Admit: 2018-03-15 | Discharge: 2018-03-15 | Disposition: A | Discharge status: Home / Self Care | Payer: 59 | Source: Ambulatory Visit | Attending: Obstetrics and Gynecology | Admitting: Obstetrics and Gynecology

## 2018-03-15 DIAGNOSIS — Z1231 Encounter for screening mammogram for malignant neoplasm of breast: Secondary | ICD-10-CM | POA: Diagnosis present

## 2018-04-13 ENCOUNTER — Ambulatory Visit (HOSPITAL_BASED_OUTPATIENT_CLINIC_OR_DEPARTMENT_OTHER): Admit: 2018-04-13 | Payer: Self-pay | Admitting: Ophthalmology

## 2018-04-13 ENCOUNTER — Encounter (HOSPITAL_BASED_OUTPATIENT_CLINIC_OR_DEPARTMENT_OTHER): Payer: Self-pay

## 2018-04-13 SURGERY — STRABISMUS SURGERY, BILATERAL
Anesthesia: General | Laterality: Bilateral

## 2018-05-29 ENCOUNTER — Other Ambulatory Visit: Payer: Self-pay | Admitting: Neurology

## 2018-06-20 ENCOUNTER — Ambulatory Visit: Payer: 59 | Admitting: Neurology

## 2018-07-27 ENCOUNTER — Other Ambulatory Visit: Payer: Self-pay | Admitting: Neurology

## 2018-07-30 ENCOUNTER — Telehealth: Payer: Self-pay | Admitting: *Deleted

## 2018-07-30 NOTE — Telephone Encounter (Signed)
Faxed completed/signed form back to [facility] for rx refill on Rebif 44mcg/0.5ml with 5 refills. Fax: [phone]. Received fax confirmation.

## 2018-08-03 ENCOUNTER — Ambulatory Visit: Payer: 59 | Admitting: Neurology

## 2018-08-03 ENCOUNTER — Encounter: Payer: Self-pay | Admitting: Neurology

## 2018-08-03 VITALS — BP 163/109 | HR 80 | Ht 65.0 in | Wt 199.0 lb

## 2018-08-03 DIAGNOSIS — F988 Other specified behavioral and emotional disorders with onset usually occurring in childhood and adolescence: Secondary | ICD-10-CM | POA: Diagnosis not present

## 2018-08-03 DIAGNOSIS — R5383 Other fatigue: Secondary | ICD-10-CM

## 2018-08-03 DIAGNOSIS — G35 Multiple sclerosis: Secondary | ICD-10-CM | POA: Diagnosis not present

## 2018-08-03 DIAGNOSIS — Z79899 Other long term (current) drug therapy: Secondary | ICD-10-CM | POA: Diagnosis not present

## 2018-08-03 DIAGNOSIS — F32A Depression, unspecified: Secondary | ICD-10-CM | POA: Insufficient documentation

## 2018-08-03 DIAGNOSIS — F322 Major depressive disorder, single episode, severe without psychotic features: Secondary | ICD-10-CM

## 2018-08-03 MED ORDER — SERTRALINE HCL 50 MG PO TABS: 50.0000 mg | ORAL_TABLET | Freq: Every day | ORAL | 1 refills | Status: DC

## 2018-08-03 MED ORDER — PHENTERMINE HCL 37.5 MG PO CAPS: 37.5000 mg | ORAL_CAPSULE | Freq: Every day | ORAL | 1 refills | Status: DC

## 2018-08-03 MED ORDER — ALPRAZOLAM 0.5 MG PO TABS: ORAL_TABLET | ORAL | 0 refills | Status: DC

## 2018-08-03 NOTE — Progress Notes (Signed)
[facility]  PATIENT: Denise Franklin DOB: Jan 05, 1971  REFERRING DOCTOR OR PCP:  Vivien Presto SOURCE: patient  _________________________________   HISTORICAL  CHIEF COMPLAINT:  Chief Complaint  Patient presents with  . Follow-up    RM 13, alone. Last seen 12/13/2017. Reports leg pain. Hurts to touch./rub from hip to knees.  Left leg bothers her more.   . Multiple Sclerosis    On Rebif. Tolerating well, no SE.   Marland Kitchen Stress    Husband lost his job and has not found a new one yet. Will lose insurance end of February 2020.     HISTORY OF PRESENT ILLNESS:  Denise Franklin is a 49 y.o. woman with MS.     Update 08/03/18: She feels it has been stable she denies any exacerbations.  She is on Rebif and she tolerates it well.     She notes left > right pain from the hip to the knee, sometimes in the joints.   She can't get comfortable.  This is worse when sitting or laying awhile.    She notes moving does help some.    She is feeling overwhelmed and stressed.    She has had more life stressors.    This all started in November.   Her husband lost his job in September.    She is on phentermine and it helps the fatigue and focus/attention some but incompletely.    She is sleeping worse.   She can't fall asleep until after midnight.  Sometimes she is not just tired, other times leg is uncomfortable and other times too many thoughts.       BP was 163/109.   Repeat was 160/100.        Update 12/13/2017: She feels it has been stable she denies any exacerbations.  She is on Rebif and she tolerates it well.  Gait does well she can climb stairs without difficulty.  She denies any weakness.  There is sometimes some numbness in the left arm that comes and goes.  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  She reports fatigue and daytime sleepiness.  She has snoring and excessive daytime sleepiness and underwent a  recent polysomnogram.   It showed snoring but no significant OSA.  The AHI was only 2.2.  There was negligible periodic limb movements of sleep that did not impact the quality of her sleep.  Mood is usually good though she sometimes has some irritability with her kids.  There is no difficulty with cognition.   We tried phentermine and it seemed to help her some last year but not this year.     On a typical night, she goes to bed at 11 to 12 and she falls asleep easily most nights but not every night (occ 2-3 am)   She wakes up at 6 am regardless of sleep onset.    She is still getting some numbness in her hands.  This is transient and often occurs when she is holding a phone.  The left hand has more of these numbness episodes than the right.  She does not have any weakness in the hands.  There is no fixed numbness.   Update 04/17/2017:    She continues on Rebif therapy for her MS. She denies any new exacerbations.  She tolerates the Rebif fairly well. She does note some flulike symptoms at times but this is been greatly helped by taking the Rebif  shot at night followed by ibuprofen. She will occasionally have some anxiety that night after the injections and will take a Xanax with benefit. An MRI of the brain 09/21/2015 showed no new lesions compared to her 2014 MRI.      Generally, gait is doing well. She did have 1 fall while hiking but it was on an uneven surface when she turned her head. She does though sometimes notes slight decreased balance. She denies any weakness. She does have numbness and tingling in the left arm but was diagnosed with carpal tunnel syndrome in the past. Typically the numbness will come and go. This occurs when she bends her arm like shampooing her hair.   Vision is fine.   Bladder is fine.     She has fatigue and sleepiness. Phentermine has not really helped the fatigue much. It has not helped any weight loss. She has been noted to snore at night. She scored a 13/24 on the  Epworth Sleepiness Scale.Marland Kitchen  Her husband has not told her that she has pauses in her breathing or snoring or gasping. Denies definite depression or anxiety but does note a little bit more irritability, especially of the kids, lately.    She denies any problems with cognition.   EPWORTH SLEEPINESS SCALE  On a scale of 0 - 3 what is the chance of dozing:  Sitting and Reading:   2 Watching TV:    2 Sitting inactive in a public place: 3 Passenger in car for one hour: 3 Lying down to rest in the afternoon: 2 Sitting and talking to someone: 0 Sitting quietly after lunch:  0 In a car, stopped in traffic:  1  Total (out of 24):   13/24   (mild excessive daytime sleepiness)    _______________________________ From 08/25/2016 MS:   She is on Rebif and tolerates it well.   She has no new exacerbations.   MRI 09/2015 was unchanged to a 2014 MRI.   Symptoms are about the same.   .     Gait/strength/sensation: She notes no major problems with gait but feels off balanced at times.   No falls.   She denies any weakness but tires out more easily.   She reports an occasional L > R painful tingling in her hands in the morning.   It is in the palm and dorsum.  Shaking helps resolve it.  Many years ago, she was diagnosed with carpal tunnel syndrome.     Bladder: She denies any difficulty with bladder frequency or hesitancy. She had a urinary tract infection this week which is unusual and she is also being evaluated for possible ovarian cyst.     Vision: She denies any MS related vision disorder.  No diplopia but she has a blurriness at times.   She has had alternating exotropia since childhood.  She sees ophtho annually.  Fatigue/sleep: She has fatigue some days, especially later in the day. Phentermine has helped hte fatigue  Sleep is good with 7+ hours most nights.  She has less daytime sleepiness on Phentermine.       She snores but has never had gasping or pauses noted (she has asked husband).      Mood/attention/cognition: She feels mood is ok.   She denies depression. .She has rare panic type spells x years (only one a month or less).   Xanax helps when she has one. She denies any significant problem with cognition but attention is poor nd she is very  distractible.  Also, she has word finding difficulties at times.   She is able to work as a part time [employer].    Weight:  She has loss 18 pounds since starting Phentermine  MS History:   She presented with left arm dysesthesias that spread to the face in June 2012. She then had an MRI of the brain that was consistent with multiple sclerosis and she was diagnosed. She started on Rebif in September 2012. She continues on Rebif and generally has tolerated it well. She has had some skin reactions but they have not been severe. Since starting Rebif she has not had any more exacerbations. Follow-up MRI in 2013 and 2014 both showed that there was a stable pattern of demyelinating foci. There were no new lesions.   REVIEW OF SYSTEMS: Constitutional: No fevers, chills, sweats, or change in appetite.  Notes  Fatigue and insomnia Eyes: No visual changes, double vision, eye pain Ear, nose and throat: No hearing loss, ear pain, nasal congestion, sore throat Cardiovascular: No chest pain, palpitations Respiratory: No shortness of breath at rest or with exertion.   No wheezes GastrointestinaI: No nausea, vomiting, diarrhea, abdominal pain, fecal incontinence Genitourinary: No dysuria, urinary retention or frequency.  No nocturia. Musculoskeletal: No neck pain, back pain Integumentary: No rash, pruritus, skin lesions Neurological: as above Psychiatric: Much more anxiety and agitation.  Some depression Endocrine: No palpitations, diaphoresis, change in appetite, change in weigh or increased thirst Hematologic/Lymphatic: No anemia, purpura, petechiae. Allergic/Immunologic: No itchy/runny eyes, nasal congestion, recent allergic reactions,  rashes  ALLERGIES: No Known Allergies  HOME MEDICATIONS:  Current Outpatient Medications:  .  ALPRAZolam (XANAX) 0.5 MG tablet, Use as needed for panic attack, Disp: 20 tablet, Rfl: 0 .  cholecalciferol (VITAMIN D) 1000 UNITS tablet, Take 2,000 Units by mouth daily. Reported on 08/18/2015, Disp: , Rfl:  .  ibuprofen (ADVIL,MOTRIN) 400 MG tablet, Take 400 mg by mouth., Disp: , Rfl:  .  Multiple Vitamin (MULTIVITAMIN) tablet, Take 1 tablet by mouth daily., Disp: , Rfl:  .  Omega-3 Fatty Acids (FISH OIL) 1000 MG CAPS, Take 1,000 each by mouth daily., Disp: , Rfl:  .  phentermine 37.5 MG capsule, Take 1 capsule (37.5 mg total) by mouth daily., Disp: 90 capsule, Rfl: 1 .  REBIF 44 MCG/0.5ML SOSY injection, Inject 0.5 mLs (44 mcg total) into the skin 3 (three) times a week., Disp: 1 Syringe, Rfl: 11 .  sertraline (ZOLOFT) 50 MG tablet, Take 1 tablet (50 mg total) by mouth daily., Disp: 90 tablet, Rfl: 1  PAST MEDICAL HISTORY: Past Medical History:  Diagnosis Date  . Infertility, female   . Multiple sclerosis (HCC)   . Vision abnormalities     PAST SURGICAL HISTORY: Past Surgical History:  Procedure Laterality Date  . EYE SURGERY Right   . LAPAROSCOPIC APPENDECTOMY      FAMILY HISTORY: Family History  Problem Relation Age of Onset  . Breast cancer Mother [id number]       Stage IV  . Kidney failure Father   . Diabetes type II Father   . Breast cancer Sister [id number]       DCIS  . Melanoma Maternal Aunt 51       mets to brain, died at 9252  . Lung cancer Maternal Uncle 7559       smoker  . Brain cancer Maternal Grandmother 5863       unknown primary cancer  . Colon cancer Maternal Grandfather 4062  . Breast cancer Maternal  Aunt 50       second cancer, 7158    SOCIAL HISTORY:  Social History   Socioeconomic History  . Marital status: Married    Spouse name: Not on file  . Number of children: Not on file  . Years of education: Not on file  . Highest education level: Not on file   Occupational History  . Not on file  Social Needs  . Financial resource strain: Not on file  . Food insecurity:    Worry: Not on file    Inability: Not on file  . Transportation needs:    Medical: Not on file    Non-medical: Not on file  Tobacco Use  . Smoking status: Never Smoker  . Smokeless tobacco: Never Used  Substance and Sexual Activity  . Alcohol use: No    Alcohol/week: 0.0 standard drinks  . Drug use: No  . Sexual activity: Not on file  Lifestyle  . Physical activity:    Days per week: Not on file    Minutes per session: Not on file  . Stress: Not on file  Relationships  . Social connections:    Talks on phone: Not on file    Gets together: Not on file    Attends religious service: Not on file    Active member of club or organization: Not on file    Attends meetings of clubs or organizations: Not on file    Relationship status: Not on file  . Intimate partner violence:    Fear of current or ex partner: Not on file    Emotionally abused: Not on file    Physically abused: Not on file    Forced sexual activity: Not on file  Other Topics Concern  . Not on file  Social History Narrative  . Not on file     PHYSICAL EXAM  Vitals:   08/03/18 1105  BP: (!) 163/109  Pulse: 80  Weight: 199 lb (90.3 kg)  Height: 5\' 5"  (1.651 m)    Body mass index is 33.12 kg/m.   General: The patient is well-developed and well-nourished and in no acute distress    Neurologic Exam  Mental status: The patient is alert and oriented x 3 at the time of the examination. The patient has apparent normal recent and remote memory, with an apparently normal attention span and concentration ability.   Speech is normal.  Cranial nerves: She has a left greater than right alternating exotropia. Pupils are equal, round and reactive to light and accommodation. Facial strength and sensation is normal. Trapezius strength is normal.  The tongue is midline, and the patient has symmetric  elevation of the soft palate. No obvious hearing deficits are noted.  Motor:  Muscle bulk is normal.   Tone is normal. Strength is  5 / 5 in all 4 extremities except 4+/5 left APB strength.  Sensory: Sensory testing is intact to touch and vibration in the arms and legs.  Coordination: Cerebellar testing reveals good finger-nose-finger and heel-to-shin bilaterally.  Gait and station: Station is normal.   Gait is normal. The tandem gait is mildly wide. The Romberg is negative.  Reflexes: Deep tendon reflexes are symmetric and normal bilaterally.     Other:   Phalen's sign on the left    DIAGNOSTIC DATA (LABS, IMAGING, TESTING) - I reviewed patient records, labs, notes, testing and imaging myself where available.      ASSESSMENT AND PLAN  Other fatigue  Multiple sclerosis (HCC) -  Plan: MR BRAIN W WO CONTRAST, CANCELED: MR BRAIN W WO CONTRAST  High risk medication use  Attention deficit disorder, unspecified hyperactivity presence  Agitated depression (HCC)   1.   Continue Rebif.   We will check an MRI of the brain to determine if she is having any subclinical activity.  If present, consider a different disease modifying therapy.   2.  She is anxious and agitated today.  She has had a lot of stress issues the last few months.  For her agitated depression, I will have her try sertraline.  If not better in 3 to 4 weeks consider adding Abilify.  Also consider counseling if not better. 3.   Stay active and exercise as tolerated. 4.  Consider a muscle relaxant at night if sleep does not improve.   5.   She will take her blood pressure at her home.  If she gets it a couple more elevated readings she should discuss with her primary care physician.   6.   She will return to see me in 6 months or sooner if she has new or worsening neurologic symptoms.   40 minutes face-to-face evaluation with greater than one half the time counseling coordinating care about her MS, stress/anxiety and  other MS related symptoms.  Uyen Eichholz A. Epimenio Foot, MD, PhD 08/03/2018, 2:22 PM Certified in Neurology, Clinical Neurophysiology, Sleep Medicine, Pain Medicine and Neuroimaging  [facility] [address] [place], [place] [address] [phone]

## 2018-08-06 ENCOUNTER — Telehealth: Payer: Self-pay | Admitting: Neurology

## 2018-08-06 NOTE — Telephone Encounter (Signed)
Spoke with Dr. Epimenio Foot. Pt does not needs labs. He reviewed recent labs in care everywhere done back in 02/2018 and they looked okay. We will monitor once yearly.

## 2018-08-06 NOTE — Telephone Encounter (Signed)
[employer] order sent to GI. They obtain the auth and will reach out to the pt to schedule.  °

## 2018-08-06 NOTE — Telephone Encounter (Signed)
Patient stated when she was in here she did not get blood work done and she stated she normally always get blood work.

## 2018-08-06 NOTE — Telephone Encounter (Signed)
Spoke to the patient she is aware.  °

## 2018-08-06 NOTE — Telephone Encounter (Signed)
I called pt back. Relayed message below. She verbalized understanding and appreciation for call.

## 2018-08-20 ENCOUNTER — Ambulatory Visit
Admission: RE | Admit: 2018-08-20 | Discharge: 2018-08-20 | Disposition: A | Discharge status: Home / Self Care | Payer: 59 | Source: Ambulatory Visit | Attending: Neurology | Admitting: Neurology

## 2018-08-20 DIAGNOSIS — G35 Multiple sclerosis: Secondary | ICD-10-CM

## 2018-08-20 MED ORDER — GADOBENATE DIMEGLUMINE 529 MG/ML IV SOLN
18.0000 mL | Freq: Once | INTRAVENOUS | Status: AC | PRN
  Administered 2018-08-20: 18 mL via INTRAVENOUS

## 2018-08-21 ENCOUNTER — Telehealth: Payer: Self-pay | Admitting: *Deleted

## 2018-08-21 NOTE — Telephone Encounter (Signed)
-----   Message from Asa Lente, MD sent at 08/20/2018  7:27 PM EST ----- Please let her know that the MRI of the brain looked good and did not show any new MS lesions.   At the last visit, she was feeling anxious and agitated.  If she is not any better please let me know and I will call in an additional medication (Abilify)

## 2018-08-21 NOTE — Telephone Encounter (Signed)
I called and spoke with pt about MRI results per Dr. Epimenio Foot note. Pt verbalized understanding.  She just started sertraline a couple days ago. They had a funeral to go to right after she saw Dr. Epimenio Foot last and she decided to start medication after she got back. She will call back in a few weeks to let us know if she has improvement with anxiety/agitation on medication.

## 2018-09-18 ENCOUNTER — Telehealth: Payer: Self-pay | Admitting: *Deleted

## 2018-09-18 NOTE — Telephone Encounter (Signed)
Faxed back completed/signed form from [employer] re: Rebif. Provided updated insurance and rx. Fax: [phone]. Received fax confirmation.

## 2018-09-24 ENCOUNTER — Telehealth: Payer: Self-pay | Admitting: *Deleted

## 2018-09-24 NOTE — Telephone Encounter (Signed)
Initiated PA Rebif on [employer]ymeds. SEG:[id number]. In process of completing.

## 2018-09-25 NOTE — Telephone Encounter (Signed)
Submitted PA. Waiting on determination. °

## 2018-09-25 NOTE — Telephone Encounter (Signed)
PA approved effective from 09/25/2018 through 09/23/2021. Faxed notice of appeal to [employer] at [phone]. Received fax confirmation.

## 2018-09-27 ENCOUNTER — Telehealth: Payer: Self-pay | Admitting: Neurology

## 2018-09-27 NOTE — Telephone Encounter (Signed)
Pt's son has been dx with the flu today. She is wanting to know what precautions can she take, should she start tamiflu, check with her PCP 1st??? Please call to advise

## 2018-09-27 NOTE — Telephone Encounter (Signed)
I called pt back. She should contact her PCP office for next steps. She should avoid touching face, wash hand frequently, disinfect, try and stay away from son as much as possible. She verbalized understanding.

## 2018-12-19 ENCOUNTER — Other Ambulatory Visit: Payer: Self-pay | Admitting: Neurology

## 2019-02-08 ENCOUNTER — Other Ambulatory Visit: Payer: Self-pay | Admitting: Neurology

## 2019-02-11 NOTE — Telephone Encounter (Signed)
Called, LVM for pt to call office or send mychart message to get next f/u scheduled. She was last seen 07/2018 and due for f/u this month. Advised phones down and might be better to send mychart message until phone issue resolved by IT.

## 2019-02-18 ENCOUNTER — Other Ambulatory Visit: Payer: Self-pay | Admitting: *Deleted

## 2019-02-18 ENCOUNTER — Other Ambulatory Visit: Payer: Self-pay | Admitting: Neurology

## 2019-02-18 MED ORDER — SERTRALINE HCL 50 MG PO TABS: 50.0000 mg | ORAL_TABLET | Freq: Every day | ORAL | 0 refills | Status: DC

## 2019-04-01 ENCOUNTER — Ambulatory Visit: Payer: BLUE CROSS/BLUE SHIELD | Admitting: Neurology

## 2019-04-01 ENCOUNTER — Other Ambulatory Visit: Payer: Self-pay

## 2019-04-01 VITALS — BP 139/80 | HR 87 | Temp 97.7°F | Ht 65.0 in | Wt 206.4 lb

## 2019-04-01 DIAGNOSIS — G35 Multiple sclerosis: Secondary | ICD-10-CM

## 2019-04-01 DIAGNOSIS — R7989 Other specified abnormal findings of blood chemistry: Secondary | ICD-10-CM

## 2019-04-01 DIAGNOSIS — R5383 Other fatigue: Secondary | ICD-10-CM | POA: Diagnosis not present

## 2019-04-01 DIAGNOSIS — F322 Major depressive disorder, single episode, severe without psychotic features: Secondary | ICD-10-CM

## 2019-04-01 DIAGNOSIS — R200 Anesthesia of skin: Secondary | ICD-10-CM | POA: Diagnosis not present

## 2019-04-01 DIAGNOSIS — Z79899 Other long term (current) drug therapy: Secondary | ICD-10-CM

## 2019-04-01 MED ORDER — ARIPIPRAZOLE 5 MG PO TABS: 5.0000 mg | ORAL_TABLET | Freq: Every day | ORAL | 5 refills | Status: DC

## 2019-04-01 NOTE — Progress Notes (Signed)
[facility]  PATIENT: Denise Franklin DOB: 11/16/1970  REFERRING DOCTOR OR PCP:  Vivien Prestoawn O'Reilly SOURCE: patient  _________________________________   HISTORICAL  CHIEF COMPLAINT:  Chief Complaint  Patient presents with  . Follow-up    RM 12, alone. Last seen 08/03/2018.  . Multiple Sclerosis    On Rebif, sertraline    HISTORY OF PRESENT ILLNESS:  [name] is a 48 y.o. woman with MS.     Update 04/01/2019: Her MS is stable.   No new numbness, weakness or gait issues.    She tolerates Rebif well.   BP today is better at 139/80. She tolerates Rebif well.   No recent labwork.   She continues on Vit D for deficiency.     She works part time at home.     She has a couple kids (16 and 10).   There is added stress with home schooling the kids.    She has added stress.   Her husband has not found a new job.     At the last visit, I added sertraline.   She was on phentermine and now takes more sparingly.    She feels the sertraline has helped some.   Her sleep is poor and she is sleepy during the day.  She wakes up after 4 hours and half the time does not fall back asleep.   She has some thoughts going through her mind at night.  We had a discussion about stress in setting of her MS and life issues.      Update 08/03/18: She feels it has been stable she denies any exacerbations.  She is on Rebif and she tolerates it well.     She notes left > right pain from the hip to the knee, sometimes in the joints.   She can't get comfortable.  This is worse when sitting or laying awhile.    She notes moving does help some.    She is feeling overwhelmed and stressed.    She has had more life stressors.    This all started in November.   Her husband lost his job in September.    She is on phentermine and it helps the fatigue and focus/attention some but incompletely.    She is sleeping worse.   She can't fall asleep until after midnight.  Sometimes she is not just  tired, other times leg is uncomfortable and other times too many thoughts.       BP was 163/109.   Repeat was 160/100.        Update 12/13/2017: She feels it has been stable she denies any exacerbations.  She is on Rebif and she tolerates it well.  Gait does well she can climb stairs without difficulty.  She denies any weakness.  There is sometimes some numbness in the left arm that comes and goes.  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  She reports fatigue and daytime sleepiness.  She has snoring and excessive daytime sleepiness and underwent a recent polysomnogram.   It showed snoring but no significant OSA.  The AHI was only 2.2.  There was negligible periodic limb movements of sleep that did not impact the quality of her sleep.  Mood is usually good though she sometimes has some irritability with her kids.  There is no difficulty with cognition.   We tried phentermine and it seemed to help her some last year but  not this year.     On a typical night, she goes to bed at 11 to 12 and she falls asleep easily most nights but not every night (occ 2-3 am)   She wakes up at 6 am regardless of sleep onset.    She is still getting some numbness in her hands.  This is transient and often occurs when she is holding a phone.  The left hand has more of these numbness episodes than the right.  She does not have any weakness in the hands.  There is no fixed numbness.   Update 04/17/2017:    She continues on Rebif therapy for her MS. She denies any new exacerbations.  She tolerates the Rebif fairly well. She does note some flulike symptoms at times but this is been greatly helped by taking the Rebif shot at night followed by ibuprofen. She will occasionally have some anxiety that night after the injections and will take a Xanax with benefit. An MRI of the brain 09/21/2015 showed no new lesions compared to her 2014 MRI.      Generally, gait is doing well.  She did have 1 fall while hiking but it was on an uneven surface when she turned her head. She does though sometimes notes slight decreased balance. She denies any weakness. She does have numbness and tingling in the left arm but was diagnosed with carpal tunnel syndrome in the past. Typically the numbness will come and go. This occurs when she bends her arm like shampooing her hair.   Vision is fine.   Bladder is fine.     She has fatigue and sleepiness. Phentermine has not really helped the fatigue much. It has not helped any weight loss. She has been noted to snore at night. She scored a 13/24 on the Epworth Sleepiness Scale.Marland Kitchen.  Her husband has not told her that she has pauses in her breathing or snoring or gasping. Denies definite depression or anxiety but does note a little bit more irritability, especially of the kids, lately.    She denies any problems with cognition.   EPWORTH SLEEPINESS SCALE  On a scale of 0 - 3 what is the chance of dozing:  Sitting and Reading:   2 Watching TV:    2 Sitting inactive in a public place: 3 Passenger in car for one hour: 3 Lying down to rest in the afternoon: 2 Sitting and talking to someone: 0 Sitting quietly after lunch:  0 In a car, stopped in traffic:  1  Total (out of 24):   13/24   (mild excessive daytime sleepiness)    _______________________________ From 08/25/2016 MS:   She is on Rebif and tolerates it well.   She has no new exacerbations.   MRI 09/2015 was unchanged to a 2014 MRI.   Symptoms are about the same.   .     Gait/strength/sensation: She notes no major problems with gait but feels off balanced at times.   No falls.   She denies any weakness but tires out more easily.   She reports an occasional L > R painful tingling in her hands in the morning.   It is in the palm and dorsum.  Shaking helps resolve it.  Many years ago, she was diagnosed with carpal tunnel syndrome.     Bladder: She denies any difficulty with bladder frequency or  hesitancy. She had a urinary tract infection this week which is unusual and she is also being evaluated for possible  ovarian cyst.     Vision: She denies any MS related vision disorder.  No diplopia but she has a blurriness at times.   She has had alternating exotropia since childhood.  She sees ophtho annually.  Fatigue/sleep: She has fatigue some days, especially later in the day. Phentermine has helped hte fatigue  Sleep is good with 7+ hours most nights.  She has less daytime sleepiness on Phentermine.       She snores but has never had gasping or pauses noted (she has asked husband).     Mood/attention/cognition: She feels mood is ok.   She denies depression. .She has rare panic type spells x years (only one a month or less).   Xanax helps when she has one. She denies any significant problem with cognition but attention is poor nd she is very distractible.  Also, she has word finding difficulties at times.   She is able to work as a part time [employer].    Weight:  She has loss 18 pounds since starting Phentermine  MS History:   She presented with left arm dysesthesias that spread to the face in June 2012. She then had an MRI of the brain that was consistent with multiple sclerosis and she was diagnosed. She started on Rebif in September 2012. She continues on Rebif and generally has tolerated it well. She has had some skin reactions but they have not been severe. Since starting Rebif she has not had any more exacerbations. Follow-up MRI in 2013 and 2014 both showed that there was a stable pattern of demyelinating foci. There were no new lesions.   REVIEW OF SYSTEMS: Constitutional: No fevers, chills, sweats, or change in appetite.  Notes  Fatigue and insomnia Eyes: No visual changes, double vision, eye pain Ear, nose and throat: No hearing loss, ear pain, nasal congestion, sore throat Cardiovascular: No chest pain, palpitations Respiratory: No shortness of breath at rest or with exertion.    No wheezes GastrointestinaI: No nausea, vomiting, diarrhea, abdominal pain, fecal incontinence Genitourinary: No dysuria, urinary retention or frequency.  No nocturia. Musculoskeletal: No neck pain, back pain Integumentary: No rash, pruritus, skin lesions Neurological: as above Psychiatric: Much more anxiety and agitation.  Some depression Endocrine: No palpitations, diaphoresis, change in appetite, change in weigh or increased thirst Hematologic/Lymphatic: No anemia, purpura, petechiae. Allergic/Immunologic: No itchy/runny eyes, nasal congestion, recent allergic reactions, rashes  ALLERGIES: No Known Allergies  HOME MEDICATIONS:  Current Outpatient Medications:  .  ALPRAZolam (XANAX) 0.5 MG tablet, Use as needed for panic attack, Disp: 20 tablet, Rfl: 0 .  cholecalciferol (VITAMIN D) 1000 UNITS tablet, Take 2,000 Units by mouth daily. Reported on 08/18/2015, Disp: , Rfl:  .  ibuprofen (ADVIL,MOTRIN) 400 MG tablet, Take 400 mg by mouth., Disp: , Rfl:  .  Multiple Vitamin (MULTIVITAMIN) tablet, Take 1 tablet by mouth daily., Disp: , Rfl:  .  Omega-3 Fatty Acids (FISH OIL) 1000 MG CAPS, Take 1,000 each by mouth daily., Disp: , Rfl:  .  REBIF 44 MCG/0.5ML SOSY injection, Inject 0.5 mLs (44 mcg total) into the skin 3 (three) times a week., Disp: 1 Syringe, Rfl: 11 .  sertraline (ZOLOFT) 50 MG tablet, Take 1 tablet (50 mg total) by mouth daily., Disp: 90 tablet, Rfl: 0 .  ARIPiprazole (ABILIFY) 5 MG tablet, Take 1 tablet (5 mg total) by mouth daily., Disp: 30 tablet, Rfl: 5 .  phentermine 37.5 MG capsule, TAKE 1 CAPSULE BY MOUTH EVERY DAY (Patient not taking: Reported on 04/01/2019),  Disp: 30 capsule, Rfl: 5  PAST MEDICAL HISTORY: Past Medical History:  Diagnosis Date  . Infertility, female   . Multiple sclerosis (HCC)   . Vision abnormalities     PAST SURGICAL HISTORY: Past Surgical History:  Procedure Laterality Date  . EYE SURGERY Right   . LAPAROSCOPIC APPENDECTOMY       FAMILY HISTORY: Family History  Problem Relation Age of Onset  . Breast cancer Mother [id number]       Stage IV  . Kidney failure Father   . Diabetes type II Father   . Breast cancer Sister 2353       DCIS  . Melanoma Maternal Aunt 51       mets to brain, died at 5252  . Lung cancer Maternal Uncle [id number]       smoker  . Brain cancer Maternal Grandmother 563       unknown primary cancer  . Colon cancer Maternal Grandfather [id number]  . Breast cancer Maternal Aunt 50       second cancer, 6158    SOCIAL HISTORY:  Social History   Socioeconomic History  . Marital status: Married    Spouse name: Not on file  . Number of children: Not on file  . Years of education: Not on file  . Highest education level: Not on file  Occupational History  . Not on file  Social Needs  . Financial resource strain: Not on file  . Food insecurity    Worry: Not on file    Inability: Not on file  . Transportation needs    Medical: Not on file    Non-medical: Not on file  Tobacco Use  . Smoking status: Never Smoker  . Smokeless tobacco: Never Used  Substance and Sexual Activity  . Alcohol use: No    Alcohol/week: 0.0 standard drinks  . Drug use: No  . Sexual activity: Not on file  Lifestyle  . Physical activity    Days per week: Not on file    Minutes per session: Not on file  . Stress: Not on file  Relationships  . Social Musicianconnections    Talks on phone: Not on file    Gets together: Not on file    Attends religious service: Not on file    Active member of club or organization: Not on file    Attends meetings of clubs or organizations: Not on file    Relationship status: Not on file  . Intimate partner violence    Fear of current or ex partner: Not on file    Emotionally abused: Not on file    Physically abused: Not on file    Forced sexual activity: Not on file  Other Topics Concern  . Not on file  Social History Narrative  . Not on file     PHYSICAL EXAM  Vitals:   04/01/19 0857  BP: 139/80   Pulse: 87  Temp: 97.7 F (36.5 C)  SpO2: 99%  Weight: 206 lb 6.4 oz (93.6 kg)  Height: 5\' 5"  (1.651 m)    Body mass index is 34.35 kg/m.   General: The patient is well-developed and well-nourished and in no acute distress    Neurologic Exam  Mental status: The patient is alert and oriented x 3 at the time of the examination. The patient has apparent normal recent and remote memory, with an apparently normal attention span and concentration ability.   Speech is normal.  Cranial nerves: She has a left  greater than right alternating exotropia. Pupils are equal, round and reactive to light and accommodation. Facial strength and sensation is normal. Trapezius strength is normal.  The tongue is midline, and the patient has symmetric elevation of the soft palate. No obvious hearing deficits are noted.  Motor:  Muscle bulk is normal.   Tone is normal. Strength is  5 / 5 in all 4 extremities except 4+/5 left APB strength.  Sensory: Sensory testing is intact to touch and vibration in the arms and legs.  Coordination: Cerebellar testing reveals good finger-nose-finger and heel-to-shin bilaterally.  Gait and station: Station is normal.   Gait is normal. The tandem gait is mildly wide. The Romberg is negative.  Reflexes: Deep tendon reflexes are symmetric and normal bilaterally.        DIAGNOSTIC DATA (LABS, IMAGING, TESTING) - I reviewed patient records, labs, notes, testing and imaging myself where available.      ASSESSMENT AND PLAN  Multiple sclerosis ([place]) - Plan: CBC with Differential/Platelet, Comprehensive metabolic panel, Thyroid Panel With TSH, VITAMIN D 25 Hydroxy (Vit-D Deficiency, Fractures)  Other fatigue - Plan: Thyroid Panel With TSH  Numbness  Low serum vitamin D - Plan: VITAMIN D 25 Hydroxy (Vit-D Deficiency, Fractures)  High risk medication use - Plan: CBC with Differential/Platelet, Comprehensive metabolic panel, Thyroid Panel With TSH, VITAMIN D 25 Hydroxy  (Vit-D Deficiency, Fractures)  Agitated depression (HCC)   1.   Continue Rebif.   We will check an MRI of the brain to determine if she is having any subclinical activity.  If present, consider a different disease modifying therapy.   2.  Continue zoloft.   Add Abilify 5 mg.   If not better change to hydroxyzine qHS (and prn daytime)     Also consider counseling if not better.   Ok to take melatonin. 3.   Stay active and exercise as tolerated. 4.  She will return to see me in 6 months or sooner if she has new or worsening neurologic symptoms.   45 minutes face-to-face evaluation with greater than one half the time counseling coordinating care about her MS, mood issues and other MS related symptoms.  Malyah Ohlrich A. Felecia Shelling, MD, PhD 1/77/9390, 3:00 AM Certified in Neurology, Clinical Neurophysiology, Sleep Medicine, Pain Medicine and Neuroimaging  [facility] [address] [place], [place] [address] [phone]

## 2019-04-02 ENCOUNTER — Telehealth: Payer: Self-pay | Admitting: *Deleted

## 2019-04-02 ENCOUNTER — Other Ambulatory Visit: Payer: Self-pay | Admitting: *Deleted

## 2019-04-02 LAB — THYROID PANEL WITH TSH
Free Thyroxine Index: 1.7 (ref 1.2–4.9)
T3 Uptake Ratio: 21 % — ABNORMAL LOW (ref 24–39)
T4, Total: 8.3 ug/dL (ref 4.5–12.0)
TSH: 6.03 u[IU]/mL — ABNORMAL HIGH (ref 0.450–4.500)

## 2019-04-02 LAB — CBC WITH DIFFERENTIAL/PLATELET
Basophils Absolute: 0 10*3/uL (ref 0.0–0.2)
Basos: 0 %
EOS (ABSOLUTE): 0.2 10*3/uL (ref 0.0–0.4)
Eos: 2 %
Hematocrit: 39.4 % (ref 34.0–46.6)
Hemoglobin: 12.4 g/dL (ref 11.1–15.9)
Immature Grans (Abs): 0 10*3/uL (ref 0.0–0.1)
Immature Granulocytes: 0 %
Lymphocytes Absolute: 2.4 10*3/uL (ref 0.7–3.1)
Lymphs: 25 %
MCH: 25.8 pg — ABNORMAL LOW (ref 26.6–33.0)
MCHC: 31.5 g/dL (ref 31.5–35.7)
MCV: 82 fL (ref 79–97)
Monocytes Absolute: 0.5 10*3/uL (ref 0.1–0.9)
Monocytes: 5 %
Neutrophils Absolute: 6.6 10*3/uL (ref 1.4–7.0)
Neutrophils: 68 %
Platelets: 341 10*3/uL (ref 150–450)
RBC: 4.8 x10E6/uL (ref 3.77–5.28)
RDW: 15 % (ref 11.7–15.4)
WBC: 9.7 10*3/uL (ref 3.4–10.8)

## 2019-04-02 LAB — COMPREHENSIVE METABOLIC PANEL
ALT: 20 IU/L (ref 0–32)
AST: 18 IU/L (ref 0–40)
Albumin/Globulin Ratio: 1.3 (ref 1.2–2.2)
Albumin: 4.2 g/dL (ref 3.8–4.8)
Alkaline Phosphatase: 125 IU/L — ABNORMAL HIGH (ref 39–117)
BUN/Creatinine Ratio: 18 (ref 9–23)
BUN: 14 mg/dL (ref 6–24)
Bilirubin Total: 0.4 mg/dL (ref 0.0–1.2)
CO2: 26 mmol/L (ref 20–29)
Calcium: 9.3 mg/dL (ref 8.7–10.2)
Chloride: 99 mmol/L (ref 96–106)
Creatinine, Ser: 0.76 mg/dL (ref 0.57–1.00)
GFR calc Af Amer: 107 mL/min/{1.73_m2} (ref >59)
GFR calc non Af Amer: 92 mL/min/{1.73_m2} (ref >59)
Globulin, Total: 3.2 g/dL (ref 1.5–4.5)
Glucose: 95 mg/dL (ref 65–99)
Potassium: 4.4 mmol/L (ref 3.5–5.2)
Sodium: 138 mmol/L (ref 134–144)
Total Protein: 7.4 g/dL (ref 6.0–8.5)

## 2019-04-02 LAB — VITAMIN D 25 HYDROXY (VIT D DEFICIENCY, FRACTURES): Vit D, 25-Hydroxy: 38.5 ng/mL (ref 30.0–100.0)

## 2019-04-02 MED ORDER — ARIPIPRAZOLE 5 MG PO TABS: 5.0000 mg | ORAL_TABLET | Freq: Every day | ORAL | 1 refills | Status: DC

## 2019-04-02 NOTE — Telephone Encounter (Signed)
-----   Message from Britt Bottom, MD sent at 04/02/2019  8:34 AM EDT ----- Please let the patient know that the lab work shows blood counts and chemistries are fine.   However, Thyroid tests show a mild hypothyroidism --- I would like to send labs to her PCP (pleasee confirm and fax if not in [facility]) and she can follow up with them.     Vit D is good now.  Continue OTC supplemetns

## 2019-04-15 ENCOUNTER — Other Ambulatory Visit (HOSPITAL_BASED_OUTPATIENT_CLINIC_OR_DEPARTMENT_OTHER): Payer: Self-pay | Admitting: Obstetrics and Gynecology

## 2019-04-15 DIAGNOSIS — Z1231 Encounter for screening mammogram for malignant neoplasm of breast: Secondary | ICD-10-CM

## 2019-04-17 ENCOUNTER — Telehealth: Payer: Self-pay | Admitting: *Deleted

## 2019-04-17 NOTE — Telephone Encounter (Signed)
Called pt. After being on Abilify for about a week, she is having trouble with speech/pronunciation. Feels like she has a lisp. She stopped taking three days ago. Has not noticed any improvement. Denies tongue swelling, starting new medication or other sx. Has not started any other new medications. She did feel it helped some with her sleep.  Advised I will speak with MD and call back.

## 2019-04-17 NOTE — Telephone Encounter (Signed)
Took call from phone staff and spoke with pharmacist with [facility]. Provided VO to fill abilify 5mg  tablet po qd. It was sent to specialty pharmacy by mistake. Nothing further needed.

## 2019-05-08 ENCOUNTER — Other Ambulatory Visit: Payer: Self-pay

## 2019-05-08 ENCOUNTER — Ambulatory Visit (HOSPITAL_BASED_OUTPATIENT_CLINIC_OR_DEPARTMENT_OTHER)
Admission: RE | Admit: 2019-05-08 | Discharge: 2019-05-08 | Disposition: A | Discharge status: Home / Self Care | Payer: BLUE CROSS/BLUE SHIELD | Source: Ambulatory Visit | Attending: Obstetrics and Gynecology | Admitting: Obstetrics and Gynecology

## 2019-05-08 DIAGNOSIS — Z1231 Encounter for screening mammogram for malignant neoplasm of breast: Secondary | ICD-10-CM | POA: Diagnosis present

## 2019-06-10 ENCOUNTER — Other Ambulatory Visit: Payer: Self-pay | Admitting: Neurology

## 2019-08-27 ENCOUNTER — Other Ambulatory Visit: Payer: Self-pay | Admitting: *Deleted

## 2019-08-27 DIAGNOSIS — G35 Multiple sclerosis: Secondary | ICD-10-CM

## 2019-08-27 MED ORDER — REBIF 44 MCG/0.5ML ~~LOC~~ SOSY: PREFILLED_SYRINGE | SUBCUTANEOUS | 11 refills | Status: DC

## 2019-09-07 ENCOUNTER — Other Ambulatory Visit: Payer: Self-pay | Admitting: Neurology

## 2019-09-30 ENCOUNTER — Ambulatory Visit: Payer: BLUE CROSS/BLUE SHIELD | Admitting: Neurology

## 2019-12-11 ENCOUNTER — Other Ambulatory Visit: Payer: Self-pay

## 2019-12-11 ENCOUNTER — Telehealth: Payer: Self-pay | Admitting: *Deleted

## 2019-12-11 ENCOUNTER — Ambulatory Visit: Payer: BC Managed Care – PPO | Admitting: Neurology

## 2019-12-11 ENCOUNTER — Encounter: Payer: Self-pay | Admitting: Neurology

## 2019-12-11 VITALS — BP 120/85 | HR 77 | Ht 65.0 in | Wt 223.0 lb

## 2019-12-11 DIAGNOSIS — Z79899 Other long term (current) drug therapy: Secondary | ICD-10-CM

## 2019-12-11 DIAGNOSIS — R200 Anesthesia of skin: Secondary | ICD-10-CM

## 2019-12-11 DIAGNOSIS — G35 Multiple sclerosis: Secondary | ICD-10-CM

## 2019-12-11 DIAGNOSIS — E669 Obesity, unspecified: Secondary | ICD-10-CM

## 2019-12-11 DIAGNOSIS — G35D Multiple sclerosis, unspecified: Secondary | ICD-10-CM

## 2019-12-11 DIAGNOSIS — R5383 Other fatigue: Secondary | ICD-10-CM | POA: Diagnosis not present

## 2019-12-11 MED ORDER — PHENTERMINE HCL 37.5 MG PO CAPS: ORAL_CAPSULE | ORAL | 5 refills | Status: DC

## 2019-12-11 MED ORDER — ALPRAZOLAM 0.5 MG PO TABS: ORAL_TABLET | ORAL | 0 refills | Status: DC

## 2019-12-11 NOTE — Progress Notes (Signed)
[facility]  PATIENT: Denise Franklin DOB: [birth year]  REFERRING DOCTOR OR PCP:  Ezequiel Ganser SOURCE: patient  _________________________________   HISTORICAL  CHIEF COMPLAINT:  Chief Complaint  Patient presents with  . Follow-up    RM 12, alone. Last seen 04/01/2019. She took Abilify for 3 weeks but gained 25 lb.   . Multiple Sclerosis    On Rebif, tolerating well. No new sx.     HISTORY OF PRESENT ILLNESS:  Denise Franklin is a 49 y.o. woman with relapsing remitting MS.     Update 12/11/2019: Her MS is doing well with no exacerbations or new neurologic symptoms.    She is walking well.   Balance and coordination are good.   Strength and sensatin are fine.   She is sleeping well   She had more issues with anxiety and depression at the last visit and was started on Abilify and continued zoloft.   She gained 24 pounds on the combination and stopped the Abilify.    Her husband is back to work and the kids have been in school lately.    She had the Covid vaccination and tolerated it well  Update 04/01/2019: Her MS is stable.   No new numbness, weakness or gait issues.    She tolerates Rebif well.   BP today is better at 139/80. She tolerates Rebif well.   No recent labwork.   She continues on Vit D for deficiency.     She works part time at home.     She has a couple kids (34 and 69).   There is added stress with home schooling the kids.    She has added stress.   Her husband has not found a new job.     At the last visit, I added sertraline.   She was on phentermine and now takes more sparingly.    She feels the sertraline has helped some.   Her sleep is poor and she is sleepy during the day.  She wakes up after 4 hours and half the time does not fall back asleep.   She has some thoughts going through her mind at night.  We had a discussion about stress in setting of her MS and life issues.      Update 08/03/18: She feels it has been stable she  denies any exacerbations.  She is on Rebif and she tolerates it well.     She notes left > right pain from the hip to the knee, sometimes in the joints.   She can't get comfortable.  This is worse when sitting or laying awhile.    She notes moving does help some.    She is feeling overwhelmed and stressed.    She has had more life stressors.    This all started in November.   Her husband lost his job in September.    She is on phentermine and it helps the fatigue and focus/attention some but incompletely.    She is sleeping worse.   She can't fall asleep until after midnight.  Sometimes she is not just tired, other times leg is uncomfortable and other times too many thoughts.       BP was 163/109.   Repeat was 160/100.        Update 12/13/2017: She feels it has been stable she denies any exacerbations.  She is on Rebif and she tolerates it well.  Gait does well she can climb stairs without difficulty.  She denies any weakness.  There is sometimes some numbness in the left arm that comes and goes.  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  She reports fatigue and daytime sleepiness.  She has snoring and excessive daytime sleepiness and underwent a recent polysomnogram.   It showed snoring but no significant OSA.  The AHI was only 2.2.  There was negligible periodic limb movements of sleep that did not impact the quality of her sleep.  Mood is usually good though she sometimes has some irritability with her kids.  There is no difficulty with cognition.   We tried phentermine and it seemed to help her some last year but not this year.     On a typical night, she goes to bed at 11 to 12 and she falls asleep easily most nights but not every night (occ 2-3 am)   She wakes up at 6 am regardless of sleep onset.    She is still getting some numbness in her hands.  This is transient and often occurs when she is holding a phone.  The left hand has more of  these numbness episodes than the right.  She does not have any weakness in the hands.  There is no fixed numbness.   Update 04/17/2017:    She continues on Rebif therapy for her MS. She denies any new exacerbations.  She tolerates the Rebif fairly well. She does note some flulike symptoms at times but this is been greatly helped by taking the Rebif shot at night followed by ibuprofen. She will occasionally have some anxiety that night after the injections and will take a Xanax with benefit. An MRI of the brain 09/21/2015 showed no new lesions compared to her 2014 MRI.      Generally, gait is doing well. She did have 1 fall while hiking but it was on an uneven surface when she turned her head. She does though sometimes notes slight decreased balance. She denies any weakness. She does have numbness and tingling in the left arm but was diagnosed with carpal tunnel syndrome in the past. Typically the numbness will come and go. This occurs when she bends her arm like shampooing her hair.   Vision is fine.   Bladder is fine.     She has fatigue and sleepiness. Phentermine has not really helped the fatigue much. It has not helped any weight loss. She has been noted to snore at night. She scored a 13/24 on the Epworth Sleepiness Scale.Marland Kitchen  Her husband has not told her that she has pauses in her breathing or snoring or gasping. Denies definite depression or anxiety but does note a little bit more irritability, especially of the kids, lately.    She denies any problems with cognition.   EPWORTH SLEEPINESS SCALE  On a scale of 0 - 3 what is the chance of dozing:  Sitting and Reading:   2 Watching TV:    2 Sitting inactive in a public place: 3 Passenger in car for one hour: 3 Lying down to rest in the afternoon: 2 Sitting and talking to someone: 0 Sitting quietly after lunch:  0 In a car, stopped in traffic:  1  Total (out of 24):   13/24   (mild excessive daytime  sleepiness)    _______________________________ From 08/25/2016 MS:   She is on Rebif and tolerates it well.   She has no new exacerbations.   MRI 09/2015 was  unchanged to a 2014 MRI.   Symptoms are about the same.   .     Gait/strength/sensation: She notes no major problems with gait but feels off balanced at times.   No falls.   She denies any weakness but tires out more easily.   She reports an occasional L > R painful tingling in her hands in the morning.   It is in the palm and dorsum.  Shaking helps resolve it.  Many years ago, she was diagnosed with carpal tunnel syndrome.     Bladder: She denies any difficulty with bladder frequency or hesitancy. She had a urinary tract infection this week which is unusual and she is also being evaluated for possible ovarian cyst.     Vision: She denies any MS related vision disorder.  No diplopia but she has a blurriness at times.   She has had alternating exotropia since childhood.  She sees ophtho annually.  Fatigue/sleep: She has fatigue some days, especially later in the day. Phentermine has helped hte fatigue  Sleep is good with 7+ hours most nights.  She has less daytime sleepiness on Phentermine.       She snores but has never had gasping or pauses noted (she has asked husband).     Mood/attention/cognition: She feels mood is ok.   She denies depression. .She has rare panic type spells x years (only one a month or less).   Xanax helps when she has one. She denies any significant problem with cognition but attention is poor nd she is very distractible.  Also, she has word finding difficulties at times.   She is able to work as a part time [employer].    Weight:  She has loss 18 pounds since starting Phentermine  MS History:   She presented with left arm dysesthesias that spread to the face in June 2012. She then had an MRI of the brain that was consistent with multiple sclerosis and she was diagnosed. She started on Rebif in September 2012. She  continues on Rebif and generally has tolerated it well. She has had some skin reactions but they have not been severe. Since starting Rebif she has not had any more exacerbations. Follow-up MRI in 2013 and 2014 both showed that there was a stable pattern of demyelinating foci. There were no new lesions.   REVIEW OF SYSTEMS: Constitutional: No fevers, chills, sweats, or change in appetite.  Notes  Fatigue and insomnia Eyes: No visual changes, double vision, eye pain Ear, nose and throat: No hearing loss, ear pain, nasal congestion, sore throat Cardiovascular: No chest pain, palpitations Respiratory: No shortness of breath at rest or with exertion.   No wheezes GastrointestinaI: No nausea, vomiting, diarrhea, abdominal pain, fecal incontinence Genitourinary: No dysuria, urinary retention or frequency.  No nocturia. Musculoskeletal: No neck pain, back pain Integumentary: No rash, pruritus, skin lesions Neurological: as above Psychiatric: Much more anxiety and agitation.  Some depression Endocrine: No palpitations, diaphoresis, change in appetite, change in weigh or increased thirst Hematologic/Lymphatic: No anemia, purpura, petechiae. Allergic/Immunologic: No itchy/runny eyes, nasal congestion, recent allergic reactions, rashes  ALLERGIES: No Known Allergies  HOME MEDICATIONS:  Current Outpatient Medications:  .  ALPRAZolam (XANAX) 0.5 MG tablet, Use as needed for panic attack (Patient taking differently: Take 0.5 mg by mouth daily as needed. Use as needed for panic attack), Disp: 20 tablet, Rfl: 0 .  cholecalciferol (VITAMIN D) 1000 UNITS tablet, Take 2,000 Units by mouth daily. Reported on 08/18/2015, Disp: , Rfl:  .  ibuprofen (ADVIL,MOTRIN) 400 MG tablet, Take 400 mg by mouth., Disp: , Rfl:  .  interferon beta-1a (REBIF) 44 MCG/0.5ML SOSY injection, Inject 0.5 mLs (44 mcg total) into the skin 3 (three) times a week., Disp: 6 mL, Rfl: 11 .  Multiple Vitamin (MULTIVITAMIN) tablet, Take  1 tablet by mouth daily., Disp: , Rfl:  .  Omega-3 Fatty Acids (FISH OIL) 1000 MG CAPS, Take 1,000 each by mouth daily., Disp: , Rfl:  .  sertraline (ZOLOFT) 50 MG tablet, TAKE ONE TABLET BY MOUTH ONE TIME DAILY , Disp: 90 tablet, Rfl: 1 .  phentermine 37.5 MG capsule, TAKE 1 CAPSULE BY MOUTH EVERY DAY, Disp: 30 capsule, Rfl: 5  PAST MEDICAL HISTORY: Past Medical History:  Diagnosis Date  . Infertility, female   . Multiple sclerosis (HCC)   . Vision abnormalities     PAST SURGICAL HISTORY: Past Surgical History:  Procedure Laterality Date  . EYE SURGERY Right   . LAPAROSCOPIC APPENDECTOMY      FAMILY HISTORY: Family History  Problem Relation Age of Onset  . Breast cancer Mother 70       Stage IV  . Kidney failure Father   . Diabetes type II Father   . Breast cancer Sister 13       DCIS  . Melanoma Maternal Aunt 51       mets to brain, died at 87  . Lung cancer Maternal Uncle 39       smoker  . Brain cancer Maternal Grandmother 98       unknown primary cancer  . Colon cancer Maternal Grandfather 73  . Breast cancer Maternal Aunt 50       second cancer, 51    SOCIAL HISTORY:  Social History   Socioeconomic History  . Marital status: Married    Spouse name: Not on file  . Number of children: Not on file  . Years of education: Not on file  . Highest education level: Not on file  Occupational History  . Not on file  Tobacco Use  . Smoking status: Never Smoker  . Smokeless tobacco: Never Used  Substance and Sexual Activity  . Alcohol use: No    Alcohol/week: 0.0 standard drinks  . Drug use: No  . Sexual activity: Not on file  Other Topics Concern  . Not on file  Social History Narrative  . Not on file   Social Determinants of Health   Financial Resource Strain:   . Difficulty of Paying Living Expenses:   Food Insecurity:   . Worried About Programme researcher, broadcasting/film/video in the Last Year:   . Barista in the Last Year:   Transportation Needs:   . Automotive engineer (Medical):   Marland Kitchen Lack of Transportation (Non-Medical):   Physical Activity:   . Days of Exercise per Week:   . Minutes of Exercise per Session:   Stress:   . Feeling of Stress :   Social Connections:   . Frequency of Communication with Friends and Family:   . Frequency of Social Gatherings with Friends and Family:   . Attends Religious Services:   . Active Member of Clubs or Organizations:   . Attends Banker Meetings:   Marland Kitchen Marital Status:   Intimate Partner Violence:   . Fear of Current or Ex-Partner:   . Emotionally Abused:   Marland Kitchen Physically Abused:   . Sexually Abused:      PHYSICAL EXAM  Vitals:  12/11/19 1055  BP: 120/85  Pulse: 77  Weight: 223 lb (101.2 kg)  Height: 5\' 5"  (1.651 m)    Body mass index is 37.11 kg/m.   General: The patient is well-developed and well-nourished and in no acute distress    Neurologic Exam  Mental status: The patient is alert and oriented x 3 at the time of the examination. The patient has apparent normal recent and remote memory, with an apparently normal attention span and concentration ability.   Speech is normal.  Cranial nerves: She has a left greater than right alternating exotropia. Pupils are equal, round and reactive to light and accommodation. Facial strength and sensation is normal. Trapezius strength is normal.  The tongue is midline, and the patient has symmetric elevation of the soft palate. No obvious hearing deficits are noted.  Motor:  Muscle bulk is normal.   Tone is normal. Strength is  5 / 5 in all 4 extremities except 4+/5 left APB strength.  Sensory: Sensory testing is intact to touch and vibration in the arms and legs.  Coordination: Cerebellar testing reveals good finger-nose-finger and heel-to-shin bilaterally.  Gait and station: Station is normal.   Gait is normal.  Tandem gait is mildly wide.  Romberg is negative.  Reflexes: Deep tendon reflexes are symmetric and normal bilaterally.          ASSESSMENT AND PLAN  Multiple sclerosis (HCC) - Plan: CBC with Differential/Platelet, Hepatic function panel  High risk medication use - Plan: CBC with Differential/Platelet, Hepatic function panel  Numbness  Other fatigue  Class 1 obesity without serious comorbidity with body mass index (BMI) of 30.0 to 30.9 in adult, unspecified obesity type   1.   Continue Rebif.   We will check an MRI of the brain to determine if she is having any subclinical activity.  If present, consider a different disease modifying therapy.   2.  Take zoloft.   Ok to take melatonin at bedtime. 3.   Stay active and exercise as tolerated. 4.   Go back on phentermine for weight loss and reduced attention. 5.   She will return to see me in 6 months or sooner if she has new or worsening neurologic symptoms.   Chanah Tidmore A. , MD, PhD 12/11/2019, 4:58 PM Certified in Neurology, Clinical Neurophysiology, Sleep Medicine, Pain Medicine and Neuroimaging  [facility] [address] [place], [place] [phone]

## 2019-12-11 NOTE — Telephone Encounter (Signed)
Took call from phone staff and spoke with Antoinette at [facility]. Provided VO from Dr. Epimenio Foot to claridy xanax directions sent in today: 1 po qd prn. She updated rx. Nothing further needed.

## 2019-12-12 LAB — CBC WITH DIFFERENTIAL/PLATELET
Basophils Absolute: 0 10*3/uL (ref 0.0–0.2)
Basos: 0 %
EOS (ABSOLUTE): 0.2 10*3/uL (ref 0.0–0.4)
Eos: 2 %
Hematocrit: 37.1 % (ref 34.0–46.6)
Hemoglobin: 11.5 g/dL (ref 11.1–15.9)
Immature Grans (Abs): 0 10*3/uL (ref 0.0–0.1)
Immature Granulocytes: 0 %
Lymphocytes Absolute: 2.3 10*3/uL (ref 0.7–3.1)
Lymphs: 26 %
MCH: 25.8 pg — ABNORMAL LOW (ref 26.6–33.0)
MCHC: 31 g/dL — ABNORMAL LOW (ref 31.5–35.7)
MCV: 83 fL (ref 79–97)
Monocytes Absolute: 0.6 10*3/uL (ref 0.1–0.9)
Monocytes: 7 %
Neutrophils Absolute: 5.8 10*3/uL (ref 1.4–7.0)
Neutrophils: 65 %
Platelets: 343 10*3/uL (ref 150–450)
RBC: 4.46 x10E6/uL (ref 3.77–5.28)
RDW: 14.7 % (ref 11.7–15.4)
WBC: 8.9 10*3/uL (ref 3.4–10.8)

## 2019-12-12 LAB — HEPATIC FUNCTION PANEL
ALT: 14 IU/L (ref 0–32)
AST: 14 IU/L (ref 0–40)
Albumin: 4.1 g/dL (ref 3.8–4.8)
Alkaline Phosphatase: 124 IU/L — ABNORMAL HIGH (ref 48–121)
Bilirubin Total: 0.4 mg/dL (ref 0.0–1.2)
Bilirubin, Direct: 0.09 mg/dL (ref 0.00–0.40)
Total Protein: 7.1 g/dL (ref 6.0–8.5)

## 2020-01-14 ENCOUNTER — Telehealth: Payer: Self-pay | Admitting: Neurology

## 2020-01-14 NOTE — Telephone Encounter (Signed)
[facility] form completed and signed by Dr Epimenio Foot for the patient for refills for the medication. Faxed and received confirmation to [employer]

## 2020-01-23 ENCOUNTER — Telehealth: Payer: Self-pay

## 2020-01-23 NOTE — Telephone Encounter (Signed)
Request Reference Number: [id number]. REBIF INJ 44/0.5 is approved through 01/22/2021. Your patient may now fill this prescription and it will be covered.

## 2020-01-23 NOTE — Telephone Encounter (Signed)
Noted, we will work on this 

## 2020-01-23 NOTE — Telephone Encounter (Signed)
Tried submitted PA on CMM. Key: [id number]. Could not locate pt. I called [employer] provider service line at [phone]. Spoke with [employer]. Current [employer]BS [place] on file expired 01/16/20. Advised [place] to call pt to verify insurance.  I called pt at [phone].  They did have a change of insurance. Now has UMR with [employer]. ID: [id number]. Group: [id number]. RxBIN: [id number]. RxPCN: [id number]. RxGrp: [id number]. Provider phone number: [phone].  She is trying to get copay assistance for Rebif. She was able to get one more fill last week via old insurance of Rebif. Advised I will submit urgent PA request via new insurance. She will upload copy of new insurance cards to her mychart.

## 2020-01-23 NOTE — Telephone Encounter (Signed)
Submitted urgent PA on CMM. Key: [id number]. Waiting on determination from [facility].

## 2020-01-23 NOTE — Telephone Encounter (Signed)
interferon beta-1a (REBIF) 44 MCG/0.5ML SOSY injection [[id number]]   REP calling to inform RN that PA is needed

## 2020-01-25 ENCOUNTER — Other Ambulatory Visit: Payer: Self-pay | Admitting: Neurology

## 2020-01-30 ENCOUNTER — Other Ambulatory Visit: Payer: Self-pay | Admitting: *Deleted

## 2020-01-30 DIAGNOSIS — G35 Multiple sclerosis: Secondary | ICD-10-CM

## 2020-01-30 MED ORDER — REBIF 44 MCG/0.5ML ~~LOC~~ SOSY: PREFILLED_SYRINGE | SUBCUTANEOUS | 11 refills | Status: DC

## 2020-02-06 NOTE — Telephone Encounter (Signed)
Received the following message from [employer]: "Good morning, The prior Berkley Harvey is approved, however the insurance is requiring another prior [place] for cost exceeds/high dollar. We have tried multiple times to get an over-ride, but none are available.   Thank you,               Kathrynn Humble  [facility] Patient Care Coordinator, [facility] [address], [place], [place], [address], [place] T     [phone] [id number] [phone]     [phone] [email or web address]"  I reached out to [employer]. They said they can help get this issue resolved. I provided contact info above. They will reach out to [facility]. I sent message back to Sue Lush to let her know [facility] will be reaching out to her.

## 2020-03-17 ENCOUNTER — Other Ambulatory Visit: Payer: Self-pay | Admitting: Neurology

## 2020-05-05 ENCOUNTER — Other Ambulatory Visit (HOSPITAL_BASED_OUTPATIENT_CLINIC_OR_DEPARTMENT_OTHER): Payer: Self-pay | Admitting: Family Medicine

## 2020-05-05 DIAGNOSIS — Z1231 Encounter for screening mammogram for malignant neoplasm of breast: Secondary | ICD-10-CM

## 2020-05-07 ENCOUNTER — Other Ambulatory Visit: Payer: Self-pay

## 2020-05-07 ENCOUNTER — Ambulatory Visit (INDEPENDENT_AMBULATORY_CARE_PROVIDER_SITE_OTHER): Payer: Commercial Managed Care - PPO

## 2020-05-07 DIAGNOSIS — Z1231 Encounter for screening mammogram for malignant neoplasm of breast: Secondary | ICD-10-CM | POA: Diagnosis not present

## 2020-05-11 ENCOUNTER — Other Ambulatory Visit: Payer: Self-pay | Admitting: Family Medicine

## 2020-05-11 DIAGNOSIS — R928 Other abnormal and inconclusive findings on diagnostic imaging of breast: Secondary | ICD-10-CM

## 2020-05-27 ENCOUNTER — Other Ambulatory Visit: Payer: Self-pay

## 2020-05-27 ENCOUNTER — Ambulatory Visit
Admission: RE | Admit: 2020-05-27 | Discharge: 2020-05-27 | Disposition: A | Discharge status: Home / Self Care | Payer: Commercial Managed Care - PPO | Source: Ambulatory Visit | Attending: Family Medicine | Admitting: Family Medicine

## 2020-05-27 ENCOUNTER — Other Ambulatory Visit: Payer: Self-pay | Admitting: Family Medicine

## 2020-05-27 DIAGNOSIS — R928 Other abnormal and inconclusive findings on diagnostic imaging of breast: Secondary | ICD-10-CM

## 2020-06-22 ENCOUNTER — Encounter: Payer: Self-pay | Admitting: Neurology

## 2020-06-22 ENCOUNTER — Ambulatory Visit (HOSPITAL_BASED_OUTPATIENT_CLINIC_OR_DEPARTMENT_OTHER): Payer: BLUE CROSS/BLUE SHIELD

## 2020-06-22 ENCOUNTER — Ambulatory Visit: Payer: Commercial Managed Care - PPO | Admitting: Neurology

## 2020-06-22 VITALS — BP 155/97 | HR 88 | Ht 65.0 in | Wt 207.0 lb

## 2020-06-22 DIAGNOSIS — F322 Major depressive disorder, single episode, severe without psychotic features: Secondary | ICD-10-CM

## 2020-06-22 DIAGNOSIS — Z79899 Other long term (current) drug therapy: Secondary | ICD-10-CM | POA: Diagnosis not present

## 2020-06-22 DIAGNOSIS — G35 Multiple sclerosis: Secondary | ICD-10-CM

## 2020-06-22 DIAGNOSIS — R5383 Other fatigue: Secondary | ICD-10-CM | POA: Diagnosis not present

## 2020-06-22 DIAGNOSIS — R200 Anesthesia of skin: Secondary | ICD-10-CM

## 2020-06-22 MED ORDER — SERTRALINE HCL 100 MG PO TABS: 100.0000 mg | ORAL_TABLET | Freq: Every day | ORAL | 4 refills | Status: DC

## 2020-06-22 NOTE — Progress Notes (Signed)
[facility]  PATIENT: Denise Franklin DOB: [birth year]  REFERRING DOCTOR OR PCP:  Vivien Presto SOURCE: patient  _________________________________   HISTORICAL  CHIEF COMPLAINT:  Chief Complaint  Patient presents with  . Follow-up    RM 12, alone. Last seen 12/11/2019.   . Multiple Sclerosis    On Rebif.     HISTORY OF PRESENT ILLNESS:  Denise Franklin is a 49 y.o. woman with relapsing remitting MS.     Update 06/22/2020: Her MS is doing well with no exacerbations or new neurologic symptoms. She is on Rebif and tolerates it well.   No major skin issues.  Gait is doing well.   Balance and coordination are good.  No falls.   She uses the bannister going downstairs but not necessarily going up. Strength and sensation are fine.  No change in vision.  She has some anxiety and depression helped by sertraline.  She stopped Abilify due to weight gain.  She is wondering about possible dose increase of sertraline.     She has some insomnia.    She had the Covid vaccination and tolerated it well  She did the booster in August  She is having a routine colonoscopy   She continues on Vit D for deficiency.       MS History:   She presented with left arm dysesthesias that spread to the face in June 2012. She then had an MRI of the brain that was consistent with multiple sclerosis and she was diagnosed. She started on Rebif in September 2012. She continues on Rebif and generally has tolerated it well. She has had some skin reactions but they have not been severe.  Follow-up MRI in 2013 and 2014 both showed that there was a stable pattern of demyelinating foci. There were no new lesions.   MRI brain 08/20/2018 shows multiple T2/flair hyperintense foci in the periventricular, juxtacortical and deep white matter of both hemispheres and in the right thalamus in a pattern and configuration consistent with chronic demyelinating plaque associated with multiple sclerosis.   None of the foci appears to be acute.  When compared to the previous MRI dated 09/18/2015, there is no interval change.  There is a normal enhancement pattern and there are no acute findings.   REVIEW OF SYSTEMS: Constitutional: No fevers, chills, sweats, or change in appetite.  Notes  Fatigue and insomnia Eyes: No visual changes, double vision, eye pain Ear, nose and throat: No hearing loss, ear pain, nasal congestion, sore throat Cardiovascular: No chest pain, palpitations Respiratory: No shortness of breath at rest or with exertion.   No wheezes GastrointestinaI: No nausea, vomiting, diarrhea, abdominal pain, fecal incontinence Genitourinary: No dysuria, urinary retention or frequency.  No nocturia. Musculoskeletal: No neck pain, back pain Integumentary: No rash, pruritus, skin lesions Neurological: as above Psychiatric: Much more anxiety and agitation.  Some depression Endocrine: No palpitations, diaphoresis, change in appetite, change in weigh or increased thirst Hematologic/Lymphatic: No anemia, purpura, petechiae. Allergic/Immunologic: No itchy/runny eyes, nasal congestion, recent allergic reactions, rashes  ALLERGIES: No Known Allergies  HOME MEDICATIONS:  Current Outpatient Medications:  .  ALPRAZolam (XANAX) 0.5 MG tablet, TAKE 1 TABLET BY MOUTH DAILY AS NEEDED FOR PANIC ATTACK, Disp: 20 tablet, Rfl: 2 .  cholecalciferol (VITAMIN D) 1000 UNITS tablet, Take 2,000 Units by mouth daily. Reported on 08/18/2015, Disp: , Rfl:  .  ibuprofen (ADVIL,MOTRIN) 400 MG tablet, Take 400 mg by mouth., Disp: , Rfl:  .  interferon beta-1a (REBIF) 44  MCG/0.5ML SOSY injection, Inject 0.5 mLs (44 mcg total) into the skin 3 (three) times a week., Disp: 6 mL, Rfl: 11 .  Multiple Vitamin (MULTIVITAMIN) tablet, Take 1 tablet by mouth daily., Disp: , Rfl:  .  Omega-3 Fatty Acids (FISH OIL) 1000 MG CAPS, Take 1,000 each by mouth daily., Disp: , Rfl:  .  phentermine 37.5 MG capsule, TAKE 1 CAPSULE BY  MOUTH EVERY DAY, Disp: 30 capsule, Rfl: 5  PAST MEDICAL HISTORY: Past Medical History:  Diagnosis Date  . Infertility, female   . Multiple sclerosis (HCC)   . Vision abnormalities     PAST SURGICAL HISTORY: Past Surgical History:  Procedure Laterality Date  . EYE SURGERY Right   . LAPAROSCOPIC APPENDECTOMY      FAMILY HISTORY: Family History  Problem Relation Age of Onset  . Breast cancer Mother 84       Stage IV  . Kidney failure Father   . Diabetes type II Father   . Breast cancer Sister 38       DCIS  . Melanoma Maternal Aunt 51       mets to brain, died at 69  . Lung cancer Maternal Uncle 66       smoker  . Brain cancer Maternal Grandmother 31       unknown primary cancer  . Colon cancer Maternal Grandfather 64  . Breast cancer Maternal Aunt 50       second cancer, 28    SOCIAL HISTORY:  Social History   Socioeconomic History  . Marital status: Married    Spouse name: Not on file  . Number of children: Not on file  . Years of education: Not on file  . Highest education level: Not on file  Occupational History  . Not on file  Tobacco Use  . Smoking status: Never Smoker  . Smokeless tobacco: Never Used  Substance and Sexual Activity  . Alcohol use: No    Alcohol/week: 0.0 standard drinks  . Drug use: No  . Sexual activity: Not on file  Other Topics Concern  . Not on file  Social History Narrative  . Not on file   Social Determinants of Health   Financial Resource Strain:   . Difficulty of Paying Living Expenses: Not on file  Food Insecurity:   . Worried About Programme researcher, broadcasting/film/video in the Last Year: Not on file  . Ran Out of Food in the Last Year: Not on file  Transportation Needs:   . Lack of Transportation (Medical): Not on file  . Lack of Transportation (Non-Medical): Not on file  Physical Activity:   . Days of Exercise per Week: Not on file  . Minutes of Exercise per Session: Not on file  Stress:   . Feeling of Stress : Not on file   Social Connections:   . Frequency of Communication with Friends and Family: Not on file  . Frequency of Social Gatherings with Friends and Family: Not on file  . Attends Religious Services: Not on file  . Active Member of Clubs or Organizations: Not on file  . Attends Banker Meetings: Not on file  . Marital Status: Not on file  Intimate Partner Violence:   . Fear of Current or Ex-Partner: Not on file  . Emotionally Abused: Not on file  . Physically Abused: Not on file  . Sexually Abused: Not on file     PHYSICAL EXAM  Vitals:   06/22/20 1049  BP: Marland Kitchen)  155/97  Pulse: 88  Weight: 207 lb (93.9 kg)  Height: 5\' 5"  (1.651 m)    Body mass index is 34.45 kg/m.   General: The patient is well-developed and well-nourished and in no acute distress    Neurologic Exam  Mental status: The patient is alert and oriented x 3 at the time of the examination. The patient has apparent normal recent and remote memory, with an apparently normal attention span and concentration ability.   Speech is normal.  Cranial nerves: She has a left greater than right alternating exotropia.   Facial strength and sensation is normal. Trapezius strength is normal.  No obvious hearing deficits are noted.  Motor:  Muscle bulk is normal.   Tone is normal. Strength is  5 / 5 in all 4 extremities except 4+/5 left APB strength.  Sensory: Sensory testing is intact to touch and vibration in the arms and legs.  Coordination: Cerebellar testing shows good FNF and HTS  Gait and station: Station is normal.   Gait is normal.  Tandem gait is mildly wide.  Romberg is negative.  Reflexes: Deep tendon reflexes are symmetric and normal bilaterally.         ASSESSMENT AND PLAN  Multiple sclerosis (HCC)  High risk medication use  Other fatigue  Agitated depression (HCC)  Numbness   1.   Continue Rebif.   Blood work was fine when last checked.  At her next visit we will recheck blood work and order  an MRI of the brain and cervical spine.. 2.    Increase zoloft.   Take melatonin at bedtime. 3.   Stay active and exercise as tolerated. 4.   Phentermine for weight loss and reduced attention. 5.   She will return to see me in 6 months or sooner if she has new or worsening neurologic symptoms.   Camp Gopal A. , MD, PhD 06/22/2020, 11:52 AM Certified in Neurology, Clinical Neurophysiology, Sleep Medicine, Pain Medicine and Neuroimaging  [facility] [address] [place], [place] [phone]

## 2020-07-10 ENCOUNTER — Other Ambulatory Visit: Payer: Self-pay | Admitting: Neurology

## 2020-11-24 ENCOUNTER — Other Ambulatory Visit: Payer: Self-pay | Admitting: Physician Assistant

## 2020-11-25 ENCOUNTER — Other Ambulatory Visit: Payer: Self-pay

## 2020-11-25 ENCOUNTER — Ambulatory Visit: Payer: Commercial Managed Care - PPO

## 2020-11-25 ENCOUNTER — Other Ambulatory Visit: Payer: Self-pay | Admitting: Family Medicine

## 2020-11-25 ENCOUNTER — Ambulatory Visit
Admission: RE | Admit: 2020-11-25 | Discharge: 2020-11-25 | Disposition: A | Discharge status: Home / Self Care | Payer: Commercial Managed Care - PPO | Source: Ambulatory Visit | Attending: Family Medicine | Admitting: Family Medicine

## 2020-11-25 DIAGNOSIS — N6310 Unspecified lump in the right breast, unspecified quadrant: Secondary | ICD-10-CM

## 2020-11-25 DIAGNOSIS — R928 Other abnormal and inconclusive findings on diagnostic imaging of breast: Secondary | ICD-10-CM

## 2020-12-23 ENCOUNTER — Telehealth (INDEPENDENT_AMBULATORY_CARE_PROVIDER_SITE_OTHER): Payer: Commercial Managed Care - PPO | Admitting: Neurology

## 2020-12-23 ENCOUNTER — Encounter: Payer: Self-pay | Admitting: Neurology

## 2020-12-23 ENCOUNTER — Telehealth: Payer: Self-pay | Admitting: Neurology

## 2020-12-23 DIAGNOSIS — G35 Multiple sclerosis: Secondary | ICD-10-CM

## 2020-12-23 DIAGNOSIS — R7989 Other specified abnormal findings of blood chemistry: Secondary | ICD-10-CM

## 2020-12-23 DIAGNOSIS — Z79899 Other long term (current) drug therapy: Secondary | ICD-10-CM | POA: Diagnosis not present

## 2020-12-23 DIAGNOSIS — R200 Anesthesia of skin: Secondary | ICD-10-CM | POA: Diagnosis not present

## 2020-12-23 DIAGNOSIS — G35D Multiple sclerosis, unspecified: Secondary | ICD-10-CM

## 2020-12-23 DIAGNOSIS — F322 Major depressive disorder, single episode, severe without psychotic features: Secondary | ICD-10-CM

## 2020-12-23 DIAGNOSIS — R5383 Other fatigue: Secondary | ICD-10-CM | POA: Diagnosis not present

## 2020-12-23 NOTE — Progress Notes (Signed)
[facility]  PATIENT: Denise Franklin DOB: October 06, 1970  REFERRING DOCTOR OR PCP:  Vivien Presto SOURCE: patient  _________________________________   HISTORICAL  CHIEF COMPLAINT:  No chief complaint on file.   HISTORY OF PRESENT ILLNESS:  Denise Franklin is a 50 y.o. woman with relapsing remitting MS.     Update 12/23/2020: Virtual Visit via Video Note I connected with Denise Franklin  on 12/23/20 at 10:00 AM EDT by a video enabled telemedicine application and verified that I am speaking with the correct person.  I discussed the limitations of evaluation and management by telemedicine and the availability of in person appointments. The patient expressed understanding and agreed to proceed.  Due to internet issues, we switched to a phone after about 10 minutes  Patient in her parked car Provider in office  History of Present Illness: Her MS is doing well with no exacerbations or new neurologic symptoms. She is on Rebif and tolerates it well.   No major skin issues and no systemic s.e..    Gait is doing well.   Balance and coordination are doing well so she does need to use the bannister Strength and sensation are fine. She will have some transient tingling.  Vision is doing well.   B;adder function is fine.  She has some anxiety and depression helped by sertraline.  She is better with the higher dose of 100 mg.  Fatigue has done better with phentermine 37.5 mg daily   She has some insomnia but this is improved.    She had the Covid vaccination and tolerated it well  She did the booster in August  She continues on Vit D for deficiency.    Latest level 05/05/2020 was 41.   LFTs were fine     MS History:   She presented with left arm dysesthesias that spread to the face in June 2012. She then had an MRI of the brain that was consistent with multiple sclerosis and she was diagnosed. She started on Rebif in September 2012. She continues on  Rebif and generally has tolerated it well. She has had some skin reactions but they have not been severe.  Follow-up MRI in 2013 and 2014 both showed that there was a stable pattern of demyelinating foci. There were no new lesions.   MRI brain 08/20/2018 shows multiple T2/flair hyperintense foci in the periventricular, juxtacortical and deep white matter of both hemispheres and in the right thalamus in a pattern and configuration consistent with chronic demyelinating plaque associated with multiple sclerosis.  None of the foci appears to be acute.  When compared to the previous MRI dated 09/18/2015, there is no interval change.  There is a normal enhancement pattern and there are no acute findings.   REVIEW OF SYSTEMS: Constitutional: No fevers, chills, sweats, or change in appetite.  Notes  Fatigue and insomnia Eyes: No visual changes, double vision, eye pain Ear, nose and throat: No hearing loss, ear pain, nasal congestion, sore throat Cardiovascular: No chest pain, palpitations Respiratory: No shortness of breath at rest or with exertion.   No wheezes GastrointestinaI: No nausea, vomiting, diarrhea, abdominal pain, fecal incontinence Genitourinary: No dysuria, urinary retention or frequency.  No nocturia. Musculoskeletal: No neck pain, back pain Integumentary: No rash, pruritus, skin lesions Neurological: as above Psychiatric: Much more anxiety and agitation.  Some depression Endocrine: No palpitations, diaphoresis, change in appetite, change in weigh or increased thirst Hematologic/Lymphatic: No anemia, purpura, petechiae. Allergic/Immunologic: No itchy/runny eyes, nasal congestion, recent  allergic reactions, rashes  ALLERGIES: No Known Allergies  HOME MEDICATIONS:  Current Outpatient Medications:  .  ALPRAZolam (XANAX) 0.5 MG tablet, TAKE 1 TABLET BY MOUTH DAILY AS NEEDED FOR PANIC ATTACK, Disp: 20 tablet, Rfl: 2 .  cholecalciferol (VITAMIN D) 1000 UNITS tablet, Take 2,000 Units by  mouth daily. Reported on 08/18/2015, Disp: , Rfl:  .  ibuprofen (ADVIL,MOTRIN) 400 MG tablet, Take 400 mg by mouth., Disp: , Rfl:  .  interferon beta-1a (REBIF) 44 MCG/0.5ML SOSY injection, Inject 0.5 mLs (44 mcg total) into the skin 3 (three) times a week., Disp: 6 mL, Rfl: 11 .  Multiple Vitamin (MULTIVITAMIN) tablet, Take 1 tablet by mouth daily., Disp: , Rfl:  .  Omega-3 Fatty Acids (FISH OIL) 1000 MG CAPS, Take 1,000 each by mouth daily., Disp: , Rfl:  .  phentermine 37.5 MG capsule, TAKE 1 CAPSULE BY MOUTH EVERY DAY, Disp: 30 capsule, Rfl: 5 .  sertraline (ZOLOFT) 100 MG tablet, Take 1 tablet (100 mg total) by mouth daily., Disp: 90 tablet, Rfl: 4  PAST MEDICAL HISTORY: Past Medical History:  Diagnosis Date  . Infertility, female   . Multiple sclerosis (HCC)   . Vision abnormalities     PAST SURGICAL HISTORY: Past Surgical History:  Procedure Laterality Date  . EYE SURGERY Right   . LAPAROSCOPIC APPENDECTOMY      FAMILY HISTORY: Family History  Problem Relation Age of Onset  . Breast cancer Mother 72       Stage IV  . Kidney failure Father   . Diabetes type II Father   . Breast cancer Sister 72       DCIS  . Melanoma Maternal Aunt 51       mets to brain, died at 3  . Lung cancer Maternal Uncle 25       smoker  . Brain cancer Maternal Grandmother 13       unknown primary cancer  . Colon cancer Maternal Grandfather 67  . Breast cancer Maternal Aunt 50       second cancer, 54    SOCIAL HISTORY:  Social History   Socioeconomic History  . Marital status: Married    Spouse name: Not on file  . Number of children: Not on file  . Years of education: Not on file  . Highest education level: Not on file  Occupational History  . Not on file  Tobacco Use  . Smoking status: Never Smoker  . Smokeless tobacco: Never Used  Substance and Sexual Activity  . Alcohol use: No    Alcohol/week: 0.0 standard drinks  . Drug use: No  . Sexual activity: Not on file  Other  Topics Concern  . Not on file  Social History Narrative  . Not on file   Social Determinants of Health   Financial Resource Strain: Not on file  Food Insecurity: Not on file  Transportation Needs: Not on file  Physical Activity: Not on file  Stress: Not on file  Social Connections: Not on file  Intimate Partner Violence: Not on file     PHYSICAL EXAM  There were no vitals filed for this visit.  There is no height or weight on file to calculate BMI.   General: The patient is well-developed and well-nourished and in no acute distress    Neurologic Exam  Mental status: The patient is alert and oriented x 3 at the time of the examination. The patient has apparent normal recent and remote memory, with an apparently  normal attention span and concentration ability.   Speech is normal.  Cranial nerves: She has a left greater than right alternating exotropia.   Facial strength and sensation is normal. Trapezius strength is normal.  No obvious hearing deficits are noted.  Motor:  Muscle bulk is normal.   Tone is normal. Strength is  5 / 5 in all 4 extremities except 4+/5 left APB strength.  Sensory: Sensory testing is intact to touch and vibration in the arms and legs.  Coordination: Cerebellar testing shows good FNF and HTS  Gait and station: Station is normal.   Gait is normal.  Tandem gait is mildly wide.  Romberg is negative.  Reflexes: Deep tendon reflexes are symmetric and normal bilaterally.         ASSESSMENT AND PLAN  Multiple sclerosis (HCC) - Plan: MR BRAIN WO CONTRAST, MR CERVICAL SPINE WO CONTRAST, CANCELED: MR BRAIN W WO CONTRAST  Numbness - Plan: MR BRAIN WO CONTRAST, MR CERVICAL SPINE WO CONTRAST  High risk medication use  Other fatigue  Agitated depression (HCC)  Low serum vitamin D   1.   Continue Rebif.   Labs were fine 04/2020 We will order an MRI of the brain and cervical spine 2.    Continue 100 mg zoloft.   Take melatonin at bedtime. 3.    Stay active and exercise as tolerated. 4.   Phentermine for weight loss and MS related reduced attention. 5.   She will return to see me in 6 months or sooner if she has new or worsening neurologic symptoms.     Follow Up Instructions: I discussed the assessment and treatment plan with the patient. The patient was provided an opportunity to ask questions and all were answered. The patient agreed with the plan and demonstrated an understanding of the instructions.    The patient was advised to call back or seek an in-person evaluation if the symptoms worsen or if the condition fails to improve as anticipated.  I provided 22 minutes of non-face-to-face time during this encounter.     Tanyon Alipio A. Epimenio Foot, MD, PhD 12/23/2020, 10:21 AM Certified in Neurology, Clinical Neurophysiology, Sleep Medicine, Pain Medicine and Neuroimaging  [facility] [address] [place], [place] [address] [phone]

## 2020-12-23 NOTE — Telephone Encounter (Signed)
..   Pt understands that although there may be some limitations with this type of visit, we will take all precautions to reduce any security or privacy concerns.  Pt understands that this will be treated like an in office visit and we will file with pt's insurance, and there may be a patient responsible charge related to this service. ? ?

## 2020-12-24 ENCOUNTER — Telehealth: Payer: Self-pay | Admitting: *Deleted

## 2020-12-24 NOTE — Telephone Encounter (Signed)
Submitted PA Rebif on CMM. KeyEben Burow - PA Case ID: [id number]. Waiting on determination from [facility].

## 2020-12-28 NOTE — Telephone Encounter (Signed)
Received fax from [employer] that PA was previously approved 12/24/20-12/24/21. [id number]. If pharmacy has trouble processing, they can call pharmacy help desk # at [phone].

## 2021-01-06 ENCOUNTER — Other Ambulatory Visit: Payer: Self-pay | Admitting: Neurology

## 2021-01-06 DIAGNOSIS — G35 Multiple sclerosis: Secondary | ICD-10-CM

## 2021-01-17 ENCOUNTER — Other Ambulatory Visit: Payer: Self-pay | Admitting: Neurology

## 2021-01-20 ENCOUNTER — Other Ambulatory Visit: Payer: Commercial Managed Care - PPO

## 2021-02-25 IMAGING — MG DIGITAL SCREENING BILAT W/ TOMO W/ CAD
6 of 10 series · 6 of 30 positions shown · non-contrast
Comparison: Previous exam(s).

ACR Breast Density Category a: The breast tissue is almost entirely
fatty.

CLINICAL DATA: Screening.

EXAM:
DIGITAL SCREENING BILATERAL MAMMOGRAM WITH TOMO AND CAD

[L XCCL synth-2D]
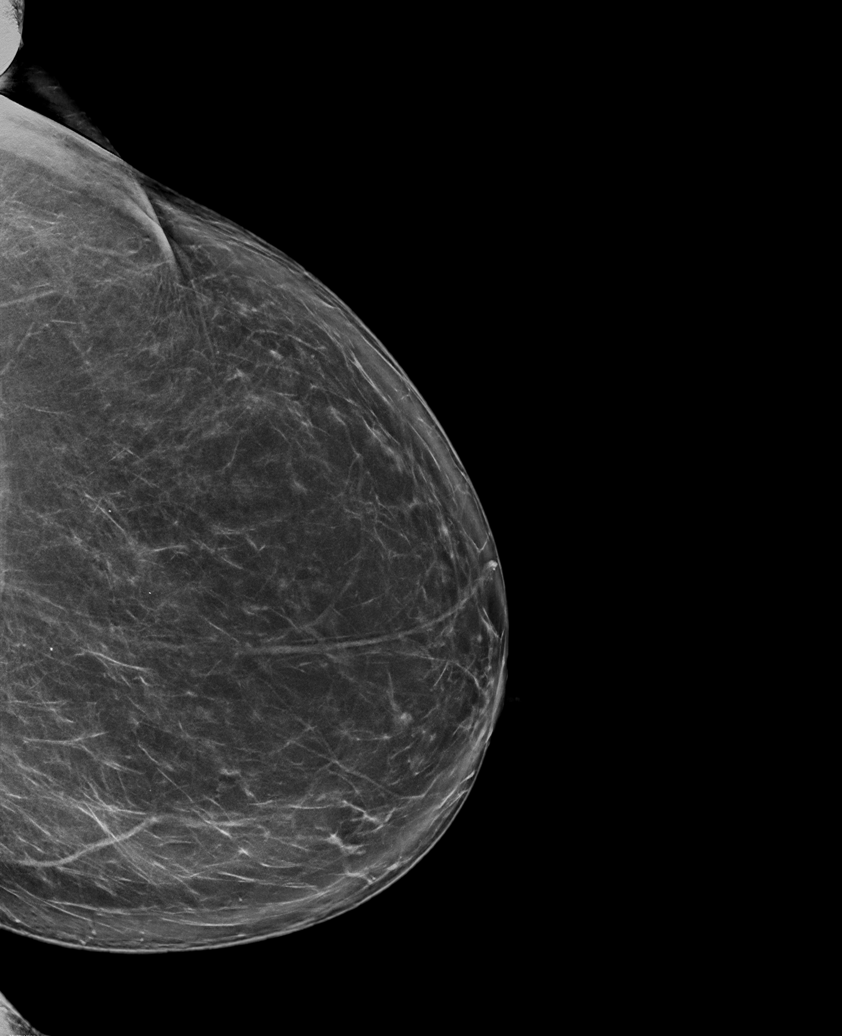

[L CC synth-2D]
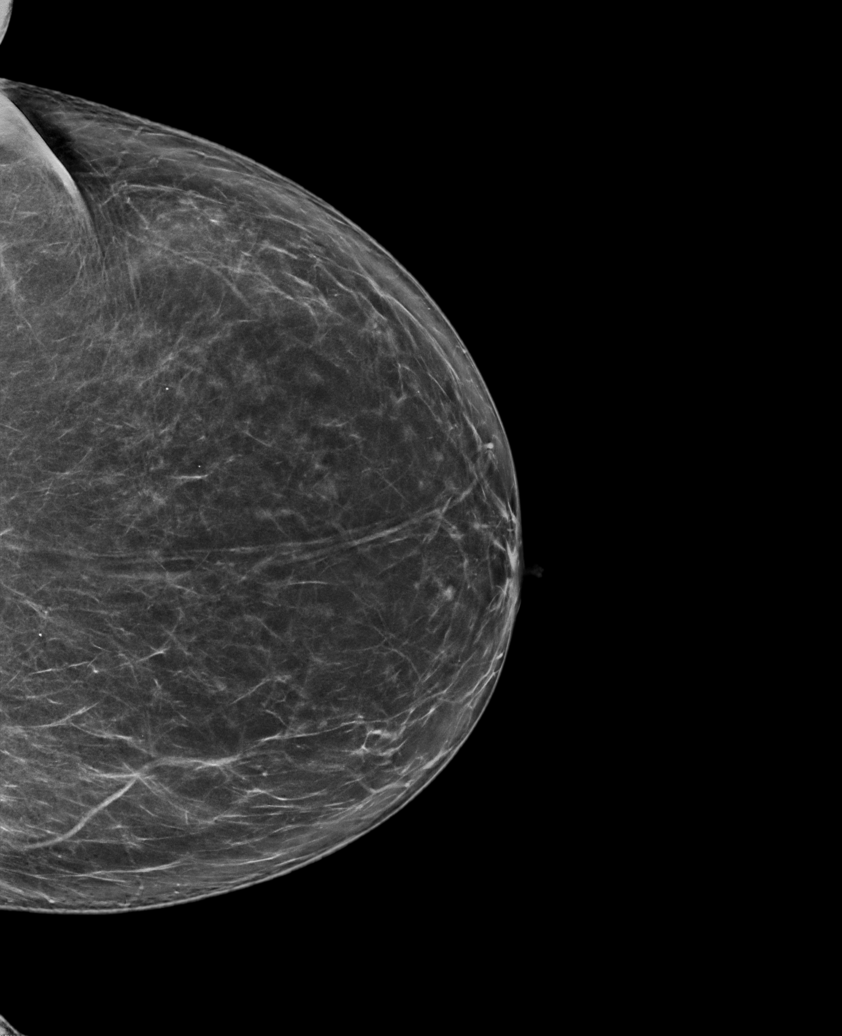

[R CC synth-2D]
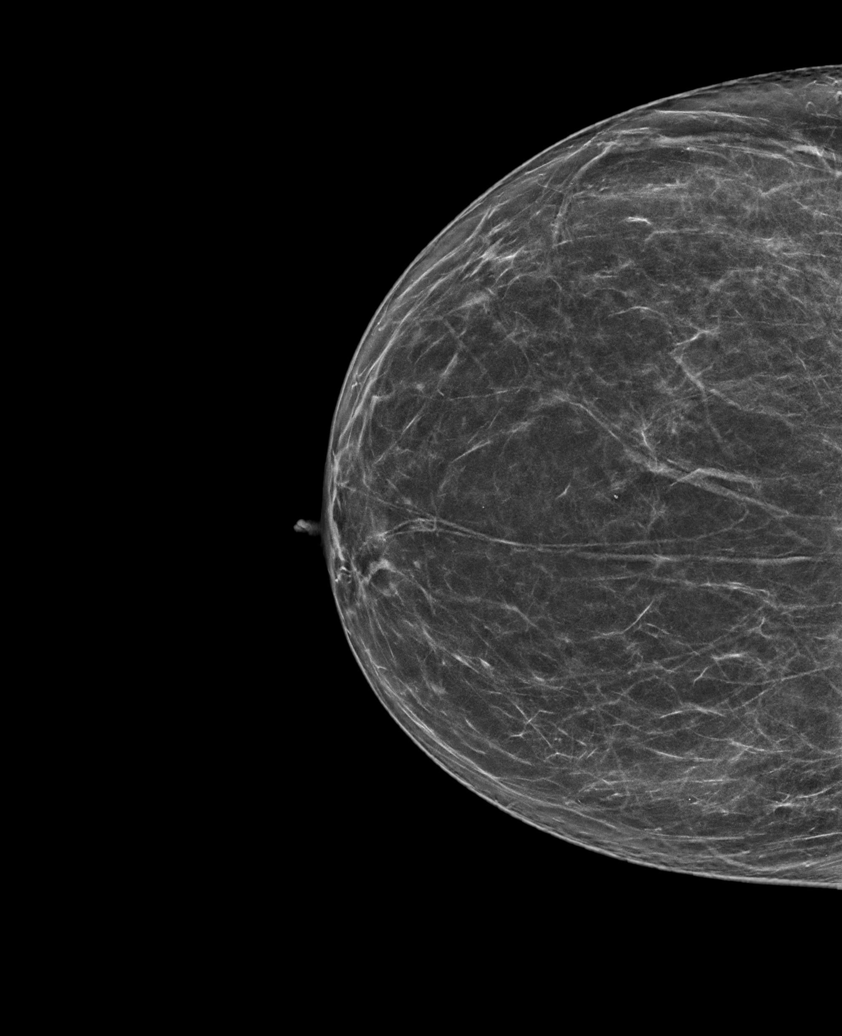

[R MLO synth-2D]
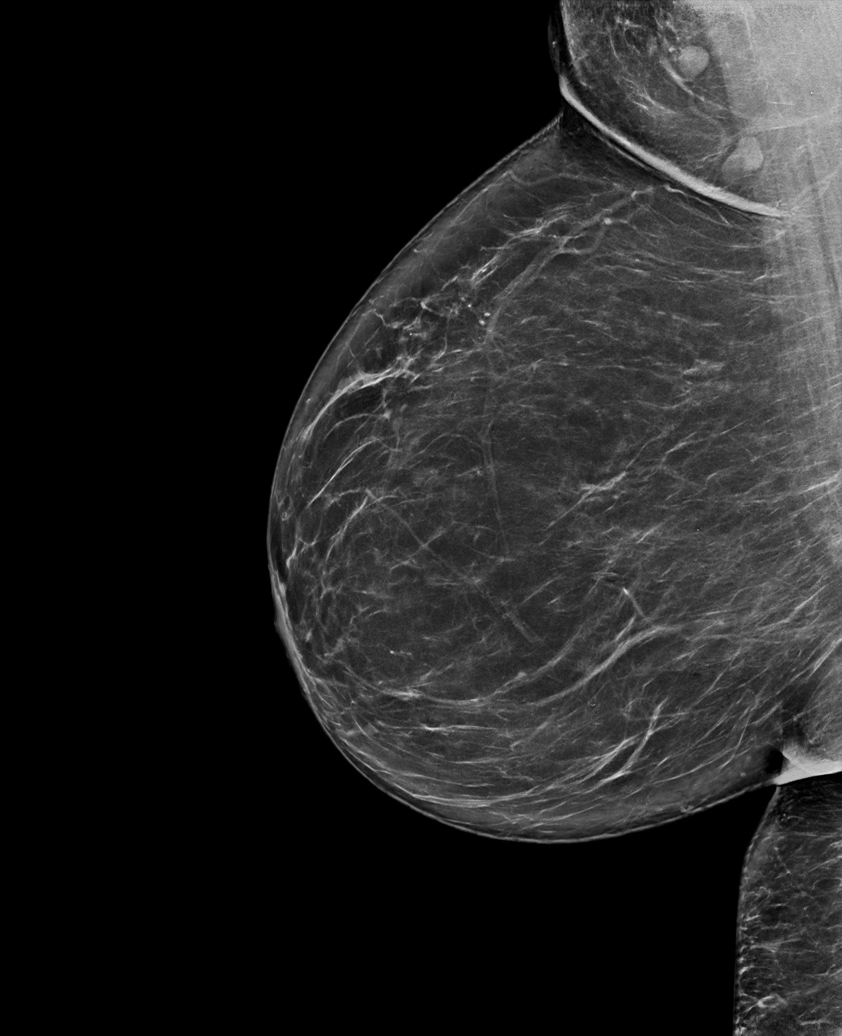

[L MLO synth-2D]
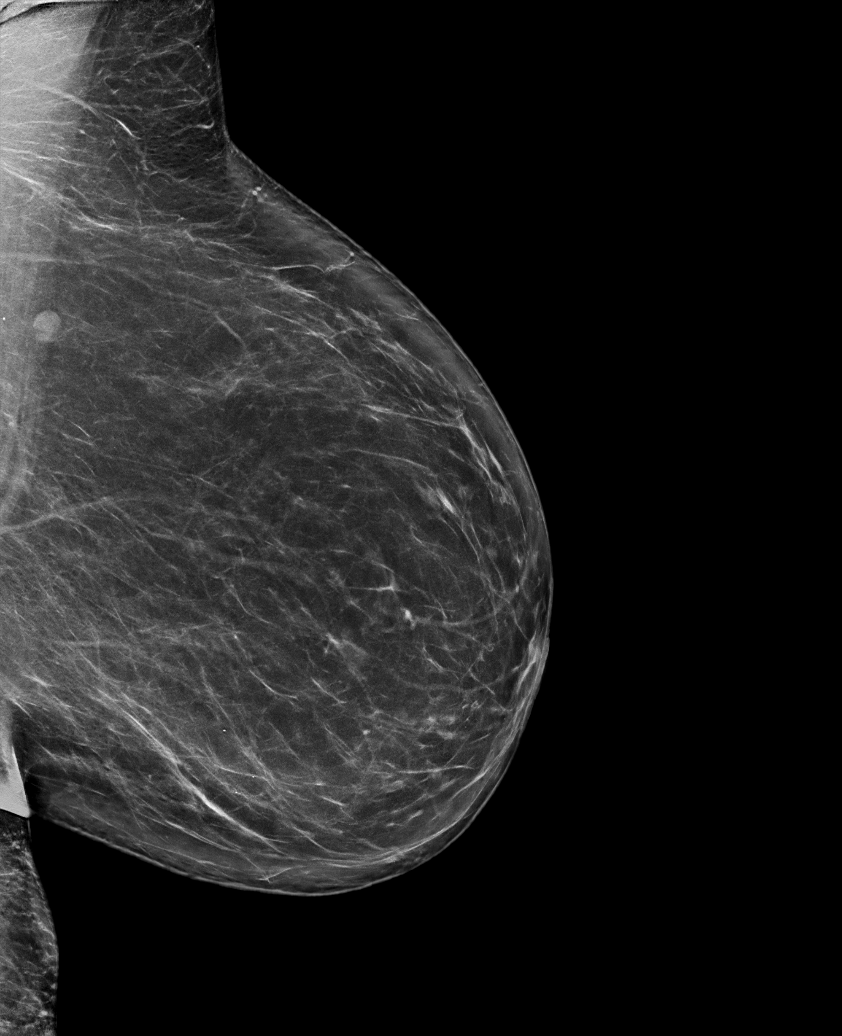

[L CC tomo · tomo slice 40/79.0]
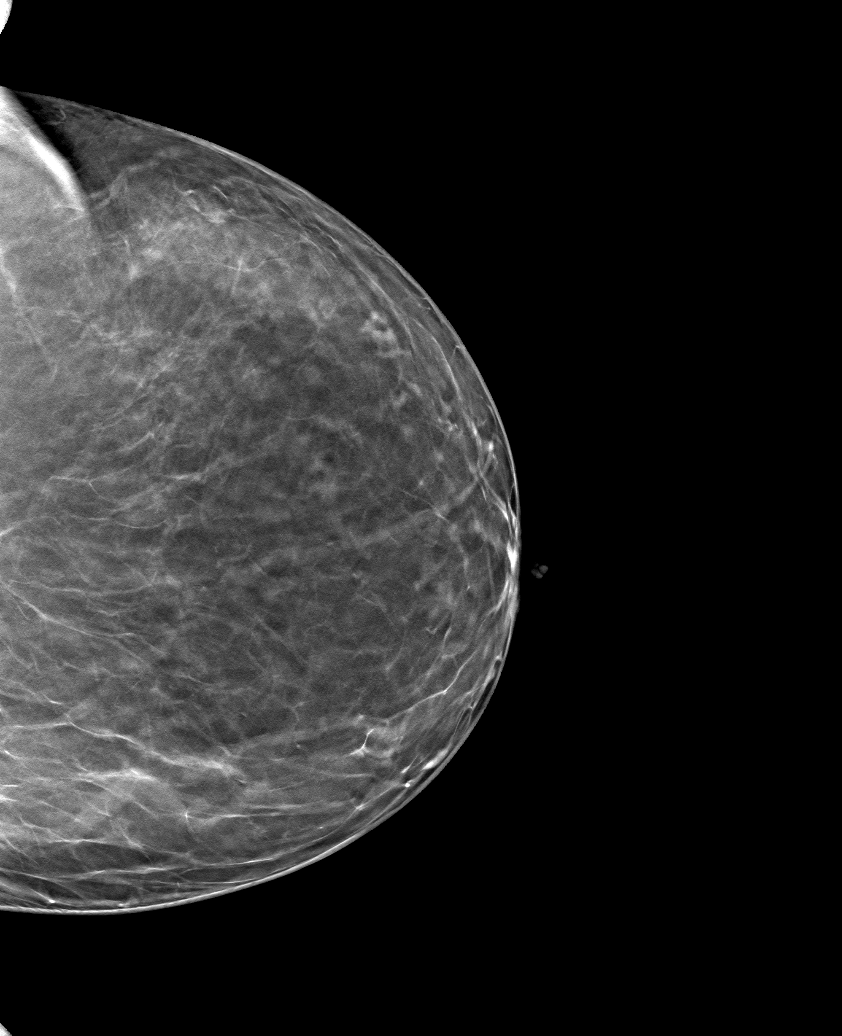

[6 of 30 positions shown; findings below may reference images not displayed]

FINDINGS: In the right breast, a possible mass warrants further evaluation. In
the left breast, no findings suspicious for malignancy. Images were
processed with CAD.
IMPRESSION: Further evaluation is suggested for a possible mass in the right
breast.

RECOMMENDATION:
Diagnostic mammogram and possibly ultrasound of the right breast.
(Code:[id number])

The patient will be contacted regarding the findings, and additional
imaging will be scheduled.

BI-RADS CATEGORY  0: Incomplete. Need additional imaging evaluation
and/or prior mammograms for comparison.

## 2021-05-10 ENCOUNTER — Ambulatory Visit
Admission: RE | Admit: 2021-05-10 | Discharge: 2021-05-10 | Disposition: A | Discharge status: Home / Self Care | Payer: Commercial Managed Care - PPO | Source: Ambulatory Visit | Attending: Family Medicine | Admitting: Family Medicine

## 2021-05-10 ENCOUNTER — Other Ambulatory Visit: Payer: Self-pay

## 2021-05-10 DIAGNOSIS — N6310 Unspecified lump in the right breast, unspecified quadrant: Secondary | ICD-10-CM

## 2021-06-30 ENCOUNTER — Ambulatory Visit: Payer: Commercial Managed Care - PPO | Admitting: Neurology

## 2021-07-13 ENCOUNTER — Other Ambulatory Visit: Payer: Self-pay | Admitting: Neurology

## 2021-08-08 ENCOUNTER — Encounter: Payer: Self-pay | Admitting: Neurology

## 2021-08-08 ENCOUNTER — Other Ambulatory Visit: Payer: Self-pay | Admitting: Neurology

## 2021-08-09 NOTE — Telephone Encounter (Signed)
Received refill request for phentermine.  Last OV was on 12/23/20.  Next OV is scheduled for 11/02/21 .  Last RX was written on 06/30/21 for 30 tabs.   [facility] has been reviewed.

## 2021-08-24 ENCOUNTER — Other Ambulatory Visit: Payer: Self-pay

## 2021-08-24 ENCOUNTER — Ambulatory Visit: Payer: Commercial Managed Care - PPO

## 2021-08-24 DIAGNOSIS — R200 Anesthesia of skin: Secondary | ICD-10-CM

## 2021-08-24 DIAGNOSIS — G35 Multiple sclerosis: Secondary | ICD-10-CM | POA: Diagnosis not present

## 2021-09-14 ENCOUNTER — Other Ambulatory Visit: Payer: Self-pay | Admitting: Neurology

## 2021-09-14 NOTE — Telephone Encounter (Signed)
Received refill request for phentermine.  Last OV was on 12/23/20.  Next OV is scheduled for 09/20/21 .  Last RX was written on 08/09/21 for 30 tabs.   [facility] has been reviewed.

## 2021-09-20 ENCOUNTER — Ambulatory Visit: Payer: Commercial Managed Care - PPO | Admitting: Neurology

## 2021-09-20 ENCOUNTER — Encounter: Payer: Self-pay | Admitting: Neurology

## 2021-09-20 VITALS — BP 124/88 | HR 95 | Ht 65.0 in | Wt 214.5 lb

## 2021-09-20 DIAGNOSIS — R200 Anesthesia of skin: Secondary | ICD-10-CM | POA: Diagnosis not present

## 2021-09-20 DIAGNOSIS — R7989 Other specified abnormal findings of blood chemistry: Secondary | ICD-10-CM | POA: Diagnosis not present

## 2021-09-20 DIAGNOSIS — Z79899 Other long term (current) drug therapy: Secondary | ICD-10-CM

## 2021-09-20 DIAGNOSIS — F32A Depression, unspecified: Secondary | ICD-10-CM

## 2021-09-20 DIAGNOSIS — G35 Multiple sclerosis: Secondary | ICD-10-CM

## 2021-09-20 NOTE — Progress Notes (Signed)
[facility]  PATIENT: Denise Franklin DOB: 11-17-1970  REFERRING DOCTOR OR PCP:  Vivien Presto SOURCE: patient  _________________________________   HISTORICAL  CHIEF COMPLAINT:  Chief Complaint  Patient presents with   Follow-up    Rm 1, alone. Here for 6 month MS f/u, on Rebif and tolerating well. Here for recent MRI findings.     HISTORY OF PRESENT ILLNESS:  Denise Franklin is a 51 y.o. woman with relapsing remitting MS.     Update 09/20/2021: Her MS is doing well with no exacerbations or new neurologic symptoms. She is on Rebif and tolerates it well.   No major skin issues.    Gait is doing well.   Balance and coordination are good.  No falls.   She will hold the bannister going downstairs and sometimes right after getting out of bed uses furniture to balance . Strength and sensation are fine.  No change in vision.  MRI performed earlier this year showed stable brain MRI.   C-spine showed 3 foci and DJD.  No comparison available.     She has some anxiety and depression helped by sertraline.  She stopped Abilify due to weight gain.  She is wondering about possible dose increase of sertraline.     She has some insomnia.     She feels she has mild cognitive issues, mostly word finding and mild audible processing.         Phentermine helps the MS fatigue and may help the focusing some    She has aching in her arms and legs.     Pressure helps the pain.     MRI shows possible right C5 nerve root compression and foramial narrowing at other levels too.     She continues on Vit D for deficiency.       MS History:   She presented with left arm dysesthesias that spread to the face in June 2012. She then had an MRI of the brain that was consistent with multiple sclerosis and she was diagnosed. She started on Rebif in September 2012. She continues on Rebif and generally has tolerated it well. She has had some skin reactions but they have not been severe.   Follow-up MRI in 2013 and 2014 both showed that there was a stable pattern of demyelinating foci. There were no new lesions.   MRI brain 08/20/2018 shows multiple T2/flair hyperintense foci in the periventricular, juxtacortical and deep white matter of both hemispheres and in the right thalamus in a pattern and configuration consistent with chronic demyelinating plaque associated with multiple sclerosis.  None of the foci appears to be acute.  When compared to the previous MRI dated 09/18/2015, there is no interval change.  There is a normal enhancement pattern and there are no acute findings.  MRI brain 08/24/2021 showed  Multiple T2/FLAIR hyperintense foci in the hemispheres and one focus in the right thalamus in a pattern consistent with chronic demyelinating plaque associated with multiple sclerosis.  None of the foci appear to be acute.  Compared to the MRI from 08/20/2018, there were no new lesions.  MRI cervical spine showed  Foci within the spinal cord adjacent to C2, C2-C3 and C3-C4 as detailed above.  These are consistent with chronic demyelinating plaque associated with multiple sclerosis.     Multilevel degenerative changes as detailed above leads to various degrees of foraminal narrowing from C3-C4 through C7-T1.  At C4-C5 there is moderately severe right foraminal narrowing with potential for right  C5 nerve root compression.  There does not appear to be nerve root compression at other levels.  There is no spinal stenosis.  REVIEW OF SYSTEMS: Constitutional: No fevers, chills, sweats, or change in appetite.  Notes  Fatigue and insomnia Eyes: No visual changes, double vision, eye pain Ear, nose and throat: No hearing loss, ear pain, nasal congestion, sore throat Cardiovascular: No chest pain, palpitations Respiratory:  No shortness of breath at rest or with exertion.   No wheezes GastrointestinaI: No nausea, vomiting, diarrhea, abdominal pain, fecal incontinence Genitourinary:  No dysuria, urinary  retention or frequency.  No nocturia. Musculoskeletal:  No neck pain, back pain Integumentary: No rash, pruritus, skin lesions Neurological: as above Psychiatric: Much more anxiety and agitation.  Some depression Endocrine: No palpitations, diaphoresis, change in appetite, change in weigh or increased thirst Hematologic/Lymphatic:  No anemia, purpura, petechiae. Allergic/Immunologic: No itchy/runny eyes, nasal congestion, recent allergic reactions, rashes  ALLERGIES: No Known Allergies  HOME MEDICATIONS:  Current Outpatient Medications:    ALPRAZolam (XANAX) 0.5 MG tablet, TAKE 1 TABLET BY MOUTH DAILY AS NEEDED FOR PANIC ATTACK, Disp: 20 tablet, Rfl: 2   cholecalciferol (VITAMIN D) 1000 UNITS tablet, Take 2,000 Units by mouth daily. Reported on 08/18/2015, Disp: , Rfl:    ibuprofen (ADVIL,MOTRIN) 400 MG tablet, Take 400 mg by mouth., Disp: , Rfl:    interferon beta-1a (REBIF) 44 MCG/0.5ML SOSY injection, INJECT SUBCUTANEOUSLY THREE TIMES WEEKLY AT LEAST 48 HOURS APART, Disp: 6 mL, Rfl: 11   Multiple Vitamin (MULTIVITAMIN) tablet, Take 1 tablet by mouth daily., Disp: , Rfl:    Omega-3 Fatty Acids (FISH OIL) 1000 MG CAPS, Take 1,000 each by mouth daily., Disp: , Rfl:    phentermine 37.5 MG capsule, TAKE 1 CAPSULE BY MOUTH EVERY DAY, Disp: 30 capsule, Rfl: 0   sertraline (ZOLOFT) 100 MG tablet, Take 1 tablet (100 mg total) by mouth daily., Disp: 90 tablet, Rfl: 1  PAST MEDICAL HISTORY: Past Medical History:  Diagnosis Date   Infertility, female    Multiple sclerosis (HCC)    Vision abnormalities     PAST SURGICAL HISTORY: Past Surgical History:  Procedure Laterality Date   EYE SURGERY Right    LAPAROSCOPIC APPENDECTOMY      FAMILY HISTORY: Family History  Problem Relation Age of Onset   Breast cancer Mother 89       Stage IV   Kidney failure Father    Diabetes type II Father    Breast cancer Sister 38       DCIS   Melanoma Maternal Aunt 51       mets to brain, died  at 43   Lung cancer Maternal Uncle 28       smoker   Brain cancer Maternal Grandmother 66       unknown primary cancer   Colon cancer Maternal Grandfather 36   Breast cancer Maternal Aunt 50       second cancer, 61    SOCIAL HISTORY:  Social History   Socioeconomic History   Marital status: Married    Spouse name: Not on file   Number of children: Not on file   Years of education: Not on file   Highest education level: Not on file  Occupational History   Not on file  Tobacco Use   Smoking status: Never   Smokeless tobacco: Never  Substance and Sexual Activity   Alcohol use: No    Alcohol/week: 0.0 standard drinks   Drug use: No  Sexual activity: Not on file  Other Topics Concern   Not on file  Social History Narrative   Not on file   Social Determinants of Health   Financial Resource Strain: Not on file  Food Insecurity: Not on file  Transportation Needs: Not on file  Physical Activity: Not on file  Stress: Not on file  Social Connections: Not on file  Intimate Partner Violence: Not on file     PHYSICAL EXAM  Vitals:   09/20/21 0949  BP: 124/88  Pulse: 95  Weight: 214 lb 8 oz (97.3 kg)  Height: 5\' 5"  (1.651 m)    Body mass index is 35.69 kg/m.   General: The patient is well-developed and well-nourished and in no acute distress    Neurologic Exam  Mental status: The patient is alert and oriented x 3 at the time of the examination. The patient has apparent normal recent and remote memory, with an apparently normal attention span and concentration ability.   Speech is normal.  Cranial nerves: She has a left greater than right alternating exotropia.   Facial strength and sensation is normal. Trapezius strength is normal.  No obvious hearing deficits are noted.  Motor:  Muscle bulk is normal.   Tone is normal. Strength is  5 / 5 in all 4 extremities except 4+/5 left APB strength.  Sensory: Sensory testing is intact to touch and vibration in the arms  and legs.  Coordination: Cerebellar testing shows good FNF and HTS  Gait and station: Station is normal.   Gait is normal.  Tandem gait is mildly wide.  Romberg is negative.  Reflexes: Deep tendon reflexes are symmetric and normal bilaterally.         ASSESSMENT AND PLAN  Multiple sclerosis (HCC)  Numbness  Low serum vitamin D  High risk medication use  Depression, unspecified depression type   1.   Continue Rebif.   She will get labs at PCP net month.  If I can get old cervical spine MRI on CD, I will make side by side comparison.    We discussed healthy diet   2.    Continue  zoloft.   Take melatonin at bedtime. 3.   Stay active and exercise as tolerated. 4.   Phentermine for weight loss and reduced attention. 5.   She will return to see me in 6 months or sooner if she has new or worsening neurologic symptoms.   Lacrystal Barbe A. Epimenio Foot, MD, PhD 09/20/2021, 4:35 PM Certified in Neurology, Clinical Neurophysiology, Sleep Medicine, Pain Medicine and Neuroimaging  [facility] [address], [address] [place], [place] [address] [phone]

## 2021-10-15 ENCOUNTER — Other Ambulatory Visit: Payer: Self-pay | Admitting: Neurology

## 2021-10-18 NOTE — Telephone Encounter (Signed)
Last OV was on 09/20/21.  ?Next OV is scheduled for 03/29/22.  ?Last RX was written on 09/14/21 for 30 tabs.  ? ?[place] Drug Database has been reviewed.  ?

## 2021-10-25 ENCOUNTER — Ambulatory Visit: Payer: Commercial Managed Care - PPO | Admitting: Neurology

## 2021-11-02 ENCOUNTER — Ambulatory Visit: Payer: Commercial Managed Care - PPO | Admitting: Neurology

## 2021-11-24 ENCOUNTER — Telehealth: Payer: Self-pay | Admitting: *Deleted

## 2021-11-24 ENCOUNTER — Other Ambulatory Visit: Payer: Self-pay | Admitting: Neurology

## 2021-11-24 NOTE — Telephone Encounter (Signed)
Submitted PA rebif on CMM. Key: [id number]. Waiting on determination from [facility]. ?

## 2021-11-24 NOTE — Telephone Encounter (Signed)
Last OV was on 09/20/21.  ?Next OV is scheduled for 03/29/22.  ?Last RX was written on 10/18/21 for 30 tabs.  ? ?[place] Drug Database has been reviewed.  ?

## 2021-11-29 NOTE — Telephone Encounter (Signed)
Request Reference Number: [id number]. REBIF INJ 44/0.5 is approved through 11/25/2022. Your patient may now fill this prescription and it will be covered. ?

## 2021-12-15 ENCOUNTER — Other Ambulatory Visit: Payer: Self-pay | Admitting: Neurology

## 2021-12-15 DIAGNOSIS — G35 Multiple sclerosis: Secondary | ICD-10-CM

## 2022-01-12 ENCOUNTER — Other Ambulatory Visit: Payer: Self-pay | Admitting: Neurology

## 2022-01-12 ENCOUNTER — Other Ambulatory Visit: Payer: Self-pay | Admitting: Diagnostic Neuroimaging

## 2022-02-20 ENCOUNTER — Other Ambulatory Visit: Payer: Self-pay | Admitting: Neurology

## 2022-02-23 NOTE — Telephone Encounter (Signed)
[place] drug registry has been verified. Last refill was 01/13/2022 # 30 for a 30 day supply.   Will send to work in since Dr. Terrace Arabia is out of the office today.

## 2022-03-29 ENCOUNTER — Ambulatory Visit: Payer: Commercial Managed Care - PPO | Admitting: Neurology

## 2022-03-29 ENCOUNTER — Telehealth: Payer: Self-pay | Admitting: Neurology

## 2022-03-29 ENCOUNTER — Encounter: Payer: Self-pay | Admitting: Neurology

## 2022-03-29 VITALS — BP 136/92 | HR 87 | Ht 65.0 in | Wt 211.5 lb

## 2022-03-29 DIAGNOSIS — F32A Depression, unspecified: Secondary | ICD-10-CM

## 2022-03-29 DIAGNOSIS — G35 Multiple sclerosis: Secondary | ICD-10-CM

## 2022-03-29 DIAGNOSIS — R269 Unspecified abnormalities of gait and mobility: Secondary | ICD-10-CM | POA: Diagnosis not present

## 2022-03-29 DIAGNOSIS — R5383 Other fatigue: Secondary | ICD-10-CM | POA: Diagnosis not present

## 2022-03-29 DIAGNOSIS — Z79899 Other long term (current) drug therapy: Secondary | ICD-10-CM

## 2022-03-29 NOTE — Progress Notes (Signed)
[facility]  PATIENT: Denise Franklin DOB: [birth year]  REFERRING DOCTOR OR PCP:  Vivien Presto SOURCE: patient  _________________________________   HISTORICAL  CHIEF COMPLAINT:  Chief Complaint  Patient presents with   Follow-up    Pt RM #3 Pt state she is feel great today and nothing has change since she was last seen.    HISTORY OF PRESENT ILLNESS:  Denise Franklin is a 51 y.o. woman with relapsing remitting MS.     Update 03/29/2022: Her MS is doing well with no exacerbations or new neurologic symptoms. She is on Rebif and tolerates it well.   No major skin issues.  Sometimes feels more tired.  Gait is doing well.   Balance and coordination are good.  No falls.   Due to mild reduced balance, she will hold the bannister going downstairs   . Strength and sensation are fine.  No change in vision.     Mood is doing well..     She has some insomnia.     She feels she has mild cognitive issues, mostly word finding and mild audible processing.   Phentermine helps the MS fatigue and focusing some.  She continues on Vit D for deficiency.       MS History:   She presented with left arm dysesthesias that spread to the face in June 2012. She then had an MRI of the brain that was consistent with multiple sclerosis and she was diagnosed. She started on Rebif in September 2012. She continues on Rebif and generally has tolerated it well. She has had some skin reactions but they have not been severe.  Follow-up MRI in 2013 and 2014 both showed that there was a stable pattern of demyelinating foci. There were no new lesions.    In retrospect in 2004, after a child was born she had 2 weeks of reduced balance and reduced sensation in her hands.   She completely recovered and did not have any imaging.      MRI brain 08/20/2018 shows multiple T2/flair hyperintense foci in the periventricular, juxtacortical and deep white matter of both hemispheres and in the right  thalamus in a pattern and configuration consistent with chronic demyelinating plaque associated with multiple sclerosis.  None of the foci appears to be acute.  When compared to the previous MRI dated 09/18/2015, there is no interval change.  There is a normal enhancement pattern and there are no acute findings.  MRI brain 08/24/2021 showed  Multiple T2/FLAIR hyperintense foci in the hemispheres and one focus in the right thalamus in a pattern consistent with chronic demyelinating plaque associated with multiple sclerosis.  None of the foci appear to be acute.  Compared to the MRI from 08/20/2018, there were no new lesions.  MRI cervical spine showed  Foci within the spinal cord adjacent to C2, C2-C3 and C3-C4 as detailed above.  These are consistent with chronic demyelinating plaque associated with multiple sclerosis.     Multilevel degenerative changes as detailed above leads to various degrees of foraminal narrowing from C3-C4 through C7-T1.  At C4-C5 there is moderately severe right foraminal narrowing with potential for right C5 nerve root compression.  There does not appear to be nerve root compression at other levels.  There is no spinal stenosis.  REVIEW OF SYSTEMS: Constitutional: No fevers, chills, sweats, or change in appetite.  Notes  Fatigue and insomnia Eyes: No visual changes, double vision, eye pain Ear, nose and throat: No hearing loss, ear pain,  nasal congestion, sore throat Cardiovascular: No chest pain, palpitations Respiratory:  No shortness of breath at rest or with exertion.   No wheezes GastrointestinaI: No nausea, vomiting, diarrhea, abdominal pain, fecal incontinence Genitourinary:  No dysuria, urinary retention or frequency.  No nocturia. Musculoskeletal:  No neck pain, back pain Integumentary: No rash, pruritus, skin lesions Neurological: as above Psychiatric: Much more anxiety and agitation.  Some depression Endocrine: No palpitations, diaphoresis, change in appetite, change  in weigh or increased thirst Hematologic/Lymphatic:  No anemia, purpura, petechiae. Allergic/Immunologic: No itchy/runny eyes, nasal congestion, recent allergic reactions, rashes   ALLERGIES: No Known Allergies  HOME MEDICATIONS:  Current Outpatient Medications:    ALPRAZolam (XANAX) 0.5 MG tablet, TAKE 1 TABLET BY MOUTH DAILY AS NEEDED FOR PANIC ATTACK (Patient taking differently: Take 0.5 mg by mouth as needed. TAKE 1 TABLET BY MOUTH DAILY AS NEEDED FOR PANIC ATTACK), Disp: 20 tablet, Rfl: 2   cholecalciferol (VITAMIN D) 1000 UNITS tablet, Take 2,000 Units by mouth daily. Reported on 08/18/2015, Disp: , Rfl:    ibuprofen (ADVIL,MOTRIN) 400 MG tablet, Take 400 mg by mouth as needed., Disp: , Rfl:    Multiple Vitamin (MULTIVITAMIN) tablet, Take 1 tablet by mouth daily., Disp: , Rfl:    Omega-3 Fatty Acids (FISH OIL) 1000 MG CAPS, Take 1,000 each by mouth daily., Disp: , Rfl:    phentermine 37.5 MG capsule, TAKE 1 CAPSULE BY MOUTH EVERY DAY, Disp: 30 capsule, Rfl: 0   REBIF 44 MCG/0.5ML SOSY injection, INJECT SUBCUTANEOUSLY THREE TIMES WEEKLY AT LEAST 48 HOURS APART, Disp: 6 mL, Rfl: 11   sertraline (ZOLOFT) 100 MG tablet, Take 1 tablet (100 mg total) by mouth daily., Disp: 90 tablet, Rfl: 2  PAST MEDICAL HISTORY: Past Medical History:  Diagnosis Date   Infertility, female    Multiple sclerosis (HCC)    Vision abnormalities     PAST SURGICAL HISTORY: Past Surgical History:  Procedure Laterality Date   EYE SURGERY Right    LAPAROSCOPIC APPENDECTOMY      FAMILY HISTORY: Family History  Problem Relation Age of Onset   Breast cancer Mother 32       Stage IV   Kidney failure Father    Diabetes type II Father    Breast cancer Sister 72       DCIS   Melanoma Maternal Aunt 51       mets to brain, died at 41   Lung cancer Maternal Uncle 4       smoker   Brain cancer Maternal Grandmother 1       unknown primary cancer   Colon cancer Maternal Grandfather 8   Breast  cancer Maternal Aunt 50       second cancer, 53    SOCIAL HISTORY:  Social History   Socioeconomic History   Marital status: Married    Spouse name: Not on file   Number of children: Not on file   Years of education: Not on file   Highest education level: Not on file  Occupational History   Not on file  Tobacco Use   Smoking status: Never   Smokeless tobacco: Never  Substance and Sexual Activity   Alcohol use: No    Alcohol/week: 0.0 standard drinks of alcohol   Drug use: No   Sexual activity: Not on file  Other Topics Concern   Not on file  Social History Narrative   Not on file   Social Determinants of Health   Financial Resource Strain:  Not on file  Food Insecurity: Not on file  Transportation Needs: Not on file  Physical Activity: Not on file  Stress: Not on file  Social Connections: Not on file  Intimate Partner Violence: Not on file     PHYSICAL EXAM  Vitals:   03/29/22 1109  BP: (!) 136/92  Pulse: 87  Weight: 211 lb 8 oz (95.9 kg)  Height: 5\' 5"  (1.651 m)    Body mass index is 35.2 kg/m.   General: The patient is well-developed and well-nourished and in no acute distress    Neurologic Exam  Mental status: The patient is alert and oriented x 3 at the time of the examination. The patient has apparent normal recent and remote memory, with an apparently normal attention span and concentration ability.   Speech is normal.  Cranial nerves: She has a left greater than right alternating exotropia.   Facial strength and sensation is normal. Trapezius strength is normal.  No obvious hearing deficits are noted.  Motor:  Muscle bulk is normal.   Tone is normal. Strength is  5 / 5 in all 4 extremities except 4+/5 left APB strength.  Sensory: Sensory testing is intact to touch and vibration in the arms and legs.  Coordination: Cerebellar testing shows good FNF and HTS  Gait and station: Station is normal.   Gait is normal and the tandem gait is wide..   Romberg is negative.  Reflexes: Deep tendon reflexes are symmetric and normal bilaterally.         ASSESSMENT AND PLAN  Multiple sclerosis (HCC) - Plan: CBC with Differential/Platelet, Comprehensive metabolic panel  Gait disturbance  High risk medication use - Plan: CBC with Differential/Platelet, Comprehensive metabolic panel  Other fatigue  Depression, unspecified depression type   1.   Continue Rebif.   We will check labs..  2.    Continue  zoloft.   Take melatonin at bedtime. 3.   Stay active and exercise as tolerated. 4.   Phentermine for MS fatigue, weight loss and reduced attention. 5.   She will return to see me in 6 months or sooner if she has new or worsening neurologic symptoms.   Jaydee Conran A. , MD, PhD 03/29/2022, 12:59 PM Certified in Neurology, Clinical Neurophysiology, Sleep Medicine, Pain Medicine and Neuroimaging  [facility] [address] [place], [place] [phone]

## 2022-03-29 NOTE — Telephone Encounter (Signed)
Pt is requesting a summary of the charges for today's visit to be mailed to her at:  [address] [place] [address]

## 2022-03-30 ENCOUNTER — Encounter: Payer: Self-pay | Admitting: Neurology

## 2022-03-30 LAB — COMPREHENSIVE METABOLIC PANEL
ALT: 15 IU/L (ref 0–32)
AST: 14 IU/L (ref 0–40)
Albumin/Globulin Ratio: 1.4 (ref 1.2–2.2)
Albumin: 4.2 g/dL (ref 3.8–4.9)
Alkaline Phosphatase: 142 IU/L — ABNORMAL HIGH (ref 44–121)
BUN/Creatinine Ratio: 17 (ref 9–23)
BUN: 12 mg/dL (ref 6–24)
Bilirubin Total: 0.4 mg/dL (ref 0.0–1.2)
CO2: 22 mmol/L (ref 20–29)
Calcium: 9.1 mg/dL (ref 8.7–10.2)
Chloride: 101 mmol/L (ref 96–106)
Creatinine, Ser: 0.72 mg/dL (ref 0.57–1.00)
Globulin, Total: 3 g/dL (ref 1.5–4.5)
Glucose: 84 mg/dL (ref 70–99)
Potassium: 4.5 mmol/L (ref 3.5–5.2)
Sodium: 139 mmol/L (ref 134–144)
Total Protein: 7.2 g/dL (ref 6.0–8.5)
eGFR: 101 mL/min/{1.73_m2} (ref >59)

## 2022-03-30 LAB — CBC WITH DIFFERENTIAL/PLATELET
Basophils Absolute: 0 10*3/uL (ref 0.0–0.2)
Basos: 0 %
EOS (ABSOLUTE): 0.1 10*3/uL (ref 0.0–0.4)
Eos: 2 %
Hematocrit: 37.2 % (ref 34.0–46.6)
Hemoglobin: 11.4 g/dL (ref 11.1–15.9)
Immature Grans (Abs): 0 10*3/uL (ref 0.0–0.1)
Immature Granulocytes: 0 %
Lymphocytes Absolute: 2.1 10*3/uL (ref 0.7–3.1)
Lymphs: 25 %
MCH: 24.3 pg — ABNORMAL LOW (ref 26.6–33.0)
MCHC: 30.6 g/dL — ABNORMAL LOW (ref 31.5–35.7)
MCV: 79 fL (ref 79–97)
Monocytes Absolute: 0.5 10*3/uL (ref 0.1–0.9)
Monocytes: 7 %
Neutrophils Absolute: 5.4 10*3/uL (ref 1.4–7.0)
Neutrophils: 66 %
Platelets: 396 10*3/uL (ref 150–450)
RBC: 4.7 x10E6/uL (ref 3.77–5.28)
RDW: 14.9 % (ref 11.7–15.4)
WBC: 8.2 10*3/uL (ref 3.4–10.8)

## 2022-03-31 ENCOUNTER — Other Ambulatory Visit: Payer: Self-pay | Admitting: Neurology

## 2022-03-31 NOTE — Telephone Encounter (Signed)
Patient is due for a refill on phentermine. Patient is up to date on her appointments. [place] Controlled Substance Registry checked and is appropriate.

## 2022-05-03 ENCOUNTER — Other Ambulatory Visit: Payer: Self-pay | Admitting: Neurology

## 2022-05-04 NOTE — Telephone Encounter (Signed)
Pt last appt was on 9/12 and has a up coming appt on 10/04/22. Pt last refill in the registry was on 04/04/22. Pt due for refill.

## 2022-06-13 ENCOUNTER — Other Ambulatory Visit: Payer: Self-pay | Admitting: Family Medicine

## 2022-06-13 DIAGNOSIS — N6310 Unspecified lump in the right breast, unspecified quadrant: Secondary | ICD-10-CM

## 2022-06-23 ENCOUNTER — Ambulatory Visit
Admission: RE | Admit: 2022-06-23 | Discharge: 2022-06-23 | Disposition: A | Discharge status: Home / Self Care | Payer: Commercial Managed Care - PPO | Source: Ambulatory Visit | Attending: Family Medicine | Admitting: Family Medicine

## 2022-06-23 DIAGNOSIS — N6310 Unspecified lump in the right breast, unspecified quadrant: Secondary | ICD-10-CM

## 2022-07-06 ENCOUNTER — Other Ambulatory Visit: Payer: Self-pay | Admitting: Neurology

## 2022-09-11 ENCOUNTER — Other Ambulatory Visit: Payer: Self-pay | Admitting: Neurology

## 2022-10-04 ENCOUNTER — Ambulatory Visit: Payer: Commercial Managed Care - PPO | Admitting: Neurology

## 2022-10-04 ENCOUNTER — Encounter: Payer: Self-pay | Admitting: Neurology

## 2022-10-04 VITALS — BP 133/93 | HR 82 | Ht 65.0 in | Wt 213.0 lb

## 2022-10-04 DIAGNOSIS — G35 Multiple sclerosis: Secondary | ICD-10-CM

## 2022-10-04 DIAGNOSIS — R5383 Other fatigue: Secondary | ICD-10-CM | POA: Diagnosis not present

## 2022-10-04 DIAGNOSIS — R269 Unspecified abnormalities of gait and mobility: Secondary | ICD-10-CM | POA: Diagnosis not present

## 2022-10-04 DIAGNOSIS — R7989 Other specified abnormal findings of blood chemistry: Secondary | ICD-10-CM

## 2022-10-04 DIAGNOSIS — Z79899 Other long term (current) drug therapy: Secondary | ICD-10-CM | POA: Diagnosis not present

## 2022-10-04 NOTE — Progress Notes (Signed)
[facility]  PATIENT: Denise Franklin DOB: 1971-04-11  REFERRING DOCTOR OR PCP:  Ezequiel Ganser SOURCE: patient  _________________________________   HISTORICAL  CHIEF COMPLAINT:  Chief Complaint  Patient presents with   Follow-up    Pt RM #3 Pt state she is feel great today and nothing has change since she was last seen.    HISTORY OF PRESENT ILLNESS:  Denise Franklin is a 52 y.o. woman with relapsing remitting MS.     Update 10/04/22 She feels her MS is doing well with no exacerbations or new neurologic symptoms. She is on Rebif and tolerates it well.   No major skin issues.  Sometimes feels more tired.  Gait is doing well.   Balance and coordination are good.  No falls.   Due to mild reduced balance, she will hold the bannister going downstairs sometimes but not always. Strength and sensation are fine.  No change in vision.     Mood is doing well..     She has some insomnia.     She feels she has mild cognitive issues, mostly word finding and mild audible processing.   Phentermine helps the MS fatigue and focusing some.  She continues on Vit D for deficiency.       MS History:   She presented with left arm dysesthesias that spread to the face in June 2012. She then had an MRI of the brain that was consistent with multiple sclerosis and she was diagnosed. She started on Rebif in September 2012. She continues on Rebif and generally has tolerated it well. She has had some skin reactions but they have not been severe.  Follow-up MRI in 2013 and 2014 both showed that there was a stable pattern of demyelinating foci. There were no new lesions.    In retrospect in 2004, after a child was born she had 2 weeks of reduced balance and reduced sensation in her hands.   She completely recovered and did not have any imaging.      MRI brain 08/20/2018 shows multiple T2/flair hyperintense foci in the periventricular, juxtacortical and deep white matter of both  hemispheres and in the right thalamus in a pattern and configuration consistent with chronic demyelinating plaque associated with multiple sclerosis.  None of the foci appears to be acute.  When compared to the previous MRI dated 09/18/2015, there is no interval change.  There is a normal enhancement pattern and there are no acute findings.  MRI brain 08/24/2021 showed  Multiple T2/FLAIR hyperintense foci in the hemispheres and one focus in the right thalamus in a pattern consistent with chronic demyelinating plaque associated with multiple sclerosis.  None of the foci appear to be acute.  Compared to the MRI from 08/20/2018, there were no new lesions.  MRI cervical spine showed  Foci within the spinal cord adjacent to C2, C2-C3 and C3-C4 as detailed above.  These are consistent with chronic demyelinating plaque associated with multiple sclerosis.     Multilevel degenerative changes as detailed above leads to various degrees of foraminal narrowing from C3-C4 through C7-T1.  At C4-C5 there is moderately severe right foraminal narrowing with potential for right C5 nerve root compression.  There does not appear to be nerve root compression at other levels.  There is no spinal stenosis.  REVIEW OF SYSTEMS: Constitutional: No fevers, chills, sweats, or change in appetite.  Notes  Fatigue and insomnia Eyes: No visual changes, double vision, eye pain Ear, nose and throat: No hearing  loss, ear pain, nasal congestion, sore throat Cardiovascular: No chest pain, palpitations Respiratory:  No shortness of breath at rest or with exertion.   No wheezes GastrointestinaI: No nausea, vomiting, diarrhea, abdominal pain, fecal incontinence Genitourinary:  No dysuria, urinary retention or frequency.  No nocturia. Musculoskeletal:  No neck pain, back pain Integumentary: No rash, pruritus, skin lesions Neurological: as above Psychiatric: Much more anxiety and agitation.  Some depression Endocrine: No palpitations,  diaphoresis, change in appetite, change in weigh or increased thirst Hematologic/Lymphatic:  No anemia, purpura, petechiae. Allergic/Immunologic: No itchy/runny eyes, nasal congestion, recent allergic reactions, rashes   ALLERGIES: No Known Allergies  HOME MEDICATIONS:  Current Outpatient Medications:    ALPRAZolam (XANAX) 0.5 MG tablet, TAKE 1 TABLET BY MOUTH DAILY AS NEEDED FOR PANIC ATTACK (Patient taking differently: Take 0.5 mg by mouth as needed. TAKE 1 TABLET BY MOUTH DAILY AS NEEDED FOR PANIC ATTACK), Disp: 20 tablet, Rfl: 2   cholecalciferol (VITAMIN D) 1000 UNITS tablet, Take 2,000 Units by mouth daily. Reported on 08/18/2015, Disp: , Rfl:    ibuprofen (ADVIL,MOTRIN) 400 MG tablet, Take 400 mg by mouth as needed., Disp: , Rfl:    Multiple Vitamin (MULTIVITAMIN) tablet, Take 1 tablet by mouth daily., Disp: , Rfl:    Omega-3 Fatty Acids (FISH OIL) 1000 MG CAPS, Take 1,000 each by mouth daily., Disp: , Rfl:    phentermine 37.5 MG capsule, TAKE 1 CAPSULE BY MOUTH EVERY DAY, Disp: 30 capsule, Rfl: 0   REBIF 44 MCG/0.5ML SOSY injection, INJECT 44MCG SUBCUTANEOUSLY THREE TIMES WEEKLY AT LEAST 48 HOURS APART, Disp: 6 mL, Rfl: 11   sertraline (ZOLOFT) 100 MG tablet, Take 1 tablet (100 mg total) by mouth daily., Disp: 90 tablet, Rfl: 2  PAST MEDICAL HISTORY: Past Medical History:  Diagnosis Date   Infertility, female    Multiple sclerosis ([place])    Vision abnormalities     PAST SURGICAL HISTORY: Past Surgical History:  Procedure Laterality Date   EYE SURGERY Right    LAPAROSCOPIC APPENDECTOMY      FAMILY HISTORY: Family History  Problem Relation Age of Onset   Breast cancer Mother 41       Stage IV   Kidney failure Father    Diabetes type II Father    Breast cancer Sister 67       DCIS   Melanoma Maternal Aunt 45       mets to brain, died at 38   Lung cancer Maternal Uncle 37       smoker   Brain cancer Maternal Grandmother 63       unknown primary cancer   Colon cancer  Maternal Grandfather 87   Breast cancer Maternal Aunt 50       second cancer, 55    SOCIAL HISTORY:  Social History   Socioeconomic History   Marital status: Married    Spouse name: Not on file   Number of children: Not on file   Years of education: Not on file   Highest education level: Not on file  Occupational History   Not on file  Tobacco Use   Smoking status: Never   Smokeless tobacco: Never  Substance and Sexual Activity   Alcohol use: No    Alcohol/week: 0.0 standard drinks of alcohol   Drug use: No   Sexual activity: Not on file  Other Topics Concern   Not on file  Social History Narrative   Not on file   Social Determinants of Health  Financial Resource Strain: Not on file  Food Insecurity: Not on file  Transportation Needs: Not on file  Physical Activity: Not on file  Stress: Not on file  Social Connections: Not on file  Intimate Partner Violence: Not on file     PHYSICAL EXAM  Vitals:   03/29/22 1109  BP: (!) 136/92  Pulse: 87  Weight: 211 lb 8 oz (95.9 kg)  Height: 5\' 5"  (1.651 m)    Body mass index is 35.2 kg/m.   General: The patient is well-developed and well-nourished and in no acute distress    Neurologic Exam  Mental status: The patient is alert and oriented x 3 at the time of the examination. The patient has apparent normal recent and remote memory, with an apparently normal attention span and concentration ability.   Speech is normal.  Cranial nerves: She has a left greater than right alternating exotropia.   Facial strength and sensation is normal. Trapezius strength is normal.  No obvious hearing deficits are noted.  Motor:  Muscle bulk is normal.   Tone is normal. Strength is  5 / 5 in all 4 extremities except 4+/5 left APB strength.  Sensory: Sensory testing is intact to touch and vibration in the arms and legs.  Coordination: Cerebellar testing shows good finger nose finger and heel to shin.   Gait and station: Station  is normal.   Gait is normal though tndem is wide..  Romberg is negative.  Reflexes: Deep tendon reflexes are symmetric and normal bilaterally.         ASSESSMENT AND PLAN  Multiple sclerosis ([place]) - Plan: CBC with Differential/Platelet, Comprehensive metabolic panel  Gait disturbance  High risk medication use - Plan: CBC with Differential/Platelet, Comprehensive metabolic panel  Other fatigue  Depression, unspecified depression type   1.   Continue Rebif.   Labs have been fine 2.    Continue  zoloft.    3.   Stay active and exercise as tolerated. 4.   Can continue Phentermine for MS fatigue, weight loss and reduced attention. 5.   She will return to see me in 6 months or sooner if she has new or worsening neurologic symptoms.   Emmitt Matthews A. Felecia Shelling, MD, PhD [id number], [id number] PM Certified in Neurology, Clinical Neurophysiology, Sleep Medicine, Pain Medicine and Neuroimaging  [facility] [address] [place], [place] [address] [phone]

## 2022-10-21 ENCOUNTER — Other Ambulatory Visit: Payer: Self-pay | Admitting: Neurology

## 2022-10-24 ENCOUNTER — Other Ambulatory Visit: Payer: Self-pay | Admitting: Neurology

## 2022-10-24 NOTE — Telephone Encounter (Signed)
Last seen on 10/04/22 per note "Continue zoloft. " Follow up scheduled on 04/20/23 Last filled on 07/16/22 # 90 tablets (90 day supply)

## 2022-10-26 ENCOUNTER — Telehealth: Payer: Self-pay | Admitting: *Deleted

## 2022-10-26 NOTE — Telephone Encounter (Signed)
Received fax from [facility] that PA for Rebif will expire soon.  Member ID: [id number], Case number: [id number]

## 2022-11-03 ENCOUNTER — Other Ambulatory Visit (HOSPITAL_COMMUNITY): Payer: Self-pay

## 2022-11-03 NOTE — Telephone Encounter (Signed)
Pharmacy Patient Advocate Encounter   Received notification from [employer] that prior authorization for Rebif 44MCG/0.5ML syringes is required/requested.   PA submitted on 11/03/2022 to (ins) [facility] via CoverMyMeds Key or [facility]) confirmation # [id number]  Status is pending

## 2022-11-07 NOTE — Telephone Encounter (Signed)
Noted  

## 2022-11-10 ENCOUNTER — Other Ambulatory Visit: Payer: Self-pay | Admitting: Neurology

## 2022-11-10 DIAGNOSIS — G35 Multiple sclerosis: Secondary | ICD-10-CM

## 2022-12-17 ENCOUNTER — Other Ambulatory Visit: Payer: Self-pay | Admitting: Neurology

## 2022-12-20 NOTE — Telephone Encounter (Signed)
Last seen on 10/01/22 Follow up scheduled on 04/20/23 Last filled on 10/24/22 #30 tablets (30 day supply) Rx pending to be signed

## 2023-02-13 ENCOUNTER — Other Ambulatory Visit: Payer: Self-pay | Admitting: Neurology

## 2023-03-16 ENCOUNTER — Other Ambulatory Visit: Payer: Self-pay | Admitting: Neurology

## 2023-03-16 NOTE — Telephone Encounter (Signed)
Last seen on 10/04/22 Follow up scheduled on 04/20/23 Last filled on 02/13/23 #30 tablets  Rx pending to be signed

## 2023-04-18 HISTORY — PX: TRIGGER FINGER RELEASE: SHX641

## 2023-04-20 ENCOUNTER — Ambulatory Visit: Payer: Commercial Managed Care - PPO | Admitting: Neurology

## 2023-04-20 ENCOUNTER — Encounter: Payer: Self-pay | Admitting: Neurology

## 2023-04-20 VITALS — BP 118/79 | HR 91 | Ht 65.0 in | Wt 215.0 lb

## 2023-04-20 DIAGNOSIS — Z79899 Other long term (current) drug therapy: Secondary | ICD-10-CM | POA: Diagnosis not present

## 2023-04-20 DIAGNOSIS — R269 Unspecified abnormalities of gait and mobility: Secondary | ICD-10-CM | POA: Diagnosis not present

## 2023-04-20 DIAGNOSIS — G35 Multiple sclerosis: Secondary | ICD-10-CM

## 2023-04-20 DIAGNOSIS — R5383 Other fatigue: Secondary | ICD-10-CM

## 2023-04-20 DIAGNOSIS — R200 Anesthesia of skin: Secondary | ICD-10-CM | POA: Diagnosis not present

## 2023-04-20 NOTE — Progress Notes (Signed)
[facility]  PATIENT: Denise Franklin DOB: 02-May-1971  REFERRING DOCTOR OR PCP:  Vivien Presto SOURCE: patient  _________________________________   HISTORICAL  CHIEF COMPLAINT:  Chief Complaint  Patient presents with   Room 11    Pt is here Alone. Pt states that things have been going with her MS. Pt states that she doesn't have any new symptoms or concerns to report today.     HISTORY OF PRESENT ILLNESS:  Denise Franklin is a 52 y.o. woman with relapsing remitting MS.     Update 04/20/2023 She feels her MS is doing well with no exacerbations or new neurologic symptoms. She is on Rebif and tolerates it well.   No major skin issues.  Sometimes feels more tired.    Gait is doing well.   Balance and coordination are good.  No falls.   Due to mild reduced balance, she will hold the bannister going downstairs sometimes but not always. Strength and sensation are fine.  She noted mild left visual change and saw ophthalmology who changed her prescription.     Mood is doing well..     She has some insomnia.     She feels she has mild cognitive issues, mostly word finding and mild audible processing.   Phentermine helps the MS fatigue and focusing some.  She continues on Vit D for deficiency.       MS History:   She presented with left arm dysesthesias that spread to the face in June 2012. She then had an MRI of the brain that was consistent with multiple sclerosis and she was diagnosed. She started on Rebif in September 2012. She continues on Rebif and generally has tolerated it well. She has had some skin reactions but they have not been severe.  Follow-up MRI in 2013 and 2014 both showed that there was a stable pattern of demyelinating foci. There were no new lesions.    In retrospect in 2004, after a child was born she had 2 weeks of reduced balance and reduced sensation in her hands.   She completely recovered and did not have any imaging.      MRI brain  08/20/2018 shows multiple T2/flair hyperintense foci in the periventricular, juxtacortical and deep white matter of both hemispheres and in the right thalamus in a pattern and configuration consistent with chronic demyelinating plaque associated with multiple sclerosis.  None of the foci appears to be acute.  When compared to the previous MRI dated 09/18/2015, there is no interval change.  There is a normal enhancement pattern and there are no acute findings.  MRI brain 08/24/2021 showed  Multiple T2/FLAIR hyperintense foci in the hemispheres and one focus in the right thalamus in a pattern consistent with chronic demyelinating plaque associated with multiple sclerosis.  None of the foci appear to be acute.  Compared to the MRI from 08/20/2018, there were no new lesions.  MRI cervical spine showed  Foci within the spinal cord adjacent to C2, C2-C3 and C3-C4 as detailed above.  These are consistent with chronic demyelinating plaque associated with multiple sclerosis.     Multilevel degenerative changes as detailed above leads to various degrees of foraminal narrowing from C3-C4 through C7-T1.  At C4-C5 there is moderately severe right foraminal narrowing with potential for right C5 nerve root compression.  There does not appear to be nerve root compression at other levels.  There is no spinal stenosis.  REVIEW OF SYSTEMS: Constitutional: No fevers, chills, sweats, or change  in appetite.  Notes  Fatigue and insomnia Eyes: No visual changes, double vision, eye pain Ear, nose and throat: No hearing loss, ear pain, nasal congestion, sore throat Cardiovascular: No chest pain, palpitations Respiratory:  No shortness of breath at rest or with exertion.   No wheezes GastrointestinaI: No nausea, vomiting, diarrhea, abdominal pain, fecal incontinence Genitourinary:  No dysuria, urinary retention or frequency.  No nocturia. Musculoskeletal:  No neck pain, back pain Integumentary: No rash, pruritus, skin  lesions Neurological: as above Psychiatric: Much more anxiety and agitation.  Some depression Endocrine: No palpitations, diaphoresis, change in appetite, change in weigh or increased thirst Hematologic/Lymphatic:  No anemia, purpura, petechiae. Allergic/Immunologic: No itchy/runny eyes, nasal congestion, recent allergic reactions, rashes   ALLERGIES: No Known Allergies  HOME MEDICATIONS:  Current Outpatient Medications:    ALPRAZolam (XANAX) 0.5 MG tablet, TAKE 1 TABLET BY MOUTH DAILY AS NEEDED FOR PANIC ATTACK (Patient taking differently: Take 0.5 mg by mouth as needed. TAKE 1 TABLET BY MOUTH DAILY AS NEEDED FOR PANIC ATTACK), Disp: 20 tablet, Rfl: 2   cholecalciferol (VITAMIN D) 1000 UNITS tablet, Take 2,000 Units by mouth daily. Reported on 08/18/2015, Disp: , Rfl:    Ferrous Sulfate (IRON PO), Take 1 tablet by mouth daily., Disp: , Rfl:    ibuprofen (ADVIL,MOTRIN) 400 MG tablet, Take 400 mg by mouth as needed., Disp: , Rfl:    Multiple Vitamin (MULTIVITAMIN) tablet, Take 1 tablet by mouth daily., Disp: , Rfl:    Omega-3 Fatty Acids (FISH OIL) 1000 MG CAPS, Take 1,000 each by mouth daily., Disp: , Rfl:    phentermine 37.5 MG capsule, TAKE 1 CAPSULE BY MOUTH EVERY DAY, Disp: 30 capsule, Rfl: 5   REBIF 44 MCG/0.5ML SOSY injection, INJECT SUBCUTANEOUSLY  THREE TIMES WEEKLY AT LEAST 48  HOURS APART, Disp: 6 mL, Rfl: 11   sertraline (ZOLOFT) 100 MG tablet, TAKE 1 TABLET BY MOUTH EVERY DAY, Disp: 90 tablet, Rfl: 2  PAST MEDICAL HISTORY: Past Medical History:  Diagnosis Date   Infertility, female    Multiple sclerosis ([facility])    Vision abnormalities     PAST SURGICAL HISTORY: Past Surgical History:  Procedure Laterality Date   EYE SURGERY Right    LAPAROSCOPIC APPENDECTOMY      FAMILY HISTORY: Family History  Problem Relation Age of Onset   Breast cancer Mother 66       Stage IV   Kidney failure Father    Diabetes type II Father    Breast cancer Sister 36       DCIS    Melanoma Maternal Aunt 51       mets to brain, died at 71   Lung cancer Maternal Uncle 26       smoker   Brain cancer Maternal Grandmother 42       unknown primary cancer   Colon cancer Maternal Grandfather 9   Breast cancer Maternal Aunt 50       second cancer, 75    SOCIAL HISTORY:  Social History   Socioeconomic History   Marital status: Married    Spouse name: Azzure Garabedian   Number of children: Not on file   Years of education: 16   Highest education level: Bachelor's degree (e.g., BA, AB, BS)  Occupational History   Not on file  Tobacco Use   Smoking status: Never   Smokeless tobacco: Never  Substance and Sexual Activity   Alcohol use: No    Alcohol/week: 0.0 standard drinks of alcohol  Drug use: No   Sexual activity: Not on file  Other Topics Concern   Not on file  Social History Narrative   Right handed   Caffeine use: very minimal   Social Determinants of Health   Financial Resource Strain: Not on file  Food Insecurity: Not on file  Transportation Needs: Not on file  Physical Activity: Not on file  Stress: Not on file  Social Connections: Unknown (11/24/2021)   [facility]   Social Network    Social Network: Not on file  Intimate Partner Violence: Unknown (10/21/2021)   Received from [facility]   HITS    Physically Hurt: Not on file    Insult or Talk Down To: Not on file    Threaten Physical Harm: Not on file    Scream or Curse: Not on file     PHYSICAL EXAM  Vitals:   04/20/23 1100  BP: 118/79  Pulse: 91  Weight: 215 lb (97.5 kg)  Height: 5\' 5"  (1.651 m)    Body mass index is 35.78 kg/m.   General: The patient is well-developed and well-nourished and in no acute distress    Neurologic Exam  Mental status: The patient is alert and oriented x 3 at the time of the examination. The patient has apparent normal recent and remote memory, with an apparently normal attention span and  concentration ability.   Speech is normal.  Cranial nerves: She has a left greater than right alternating exotropia.   Facial strength and sensation is normal. Trapezius strength is normal.  No obvious hearing deficits are noted.  Motor:  Muscle bulk is normal.   Tone is normal. Strength is  5 / 5 in all 4 extremities except 4+/5 left APB strength.  Sensory: Sensory testing is intact to touch and vibration in the arms and legs.  Coordination: Cerebellar testing shows good finger nose finger and heel to shin.   Gait and station: Station is normal.   Gait is normal though tndem is wide..  Romberg is negative.  Reflexes: Deep tendon reflexes are symmetric and normal bilaterally.         ASSESSMENT AND PLAN  Multiple sclerosis ([facility]) - Plan: Hepatitis B surface antigen, HIV Antibody (routine testing w rflx), Varicella zoster antibody, IgG, QuantiFERON-TB Gold Plus, Comprehensive metabolic panel, CBC with Differential/Platelet, Hepatitis B core antibody, total, Hepatitis B surface antibody,qualitative, MR BRAIN W WO CONTRAST  High risk medication use - Plan: Hepatitis B surface antigen, HIV Antibody (routine testing w rflx), Varicella zoster antibody, IgG, QuantiFERON-TB Gold Plus, Comprehensive metabolic panel, CBC with Differential/Platelet, Hepatitis B core antibody, total, Hepatitis B surface antibody,qualitative, MR BRAIN W WO CONTRAST  Numbness - Plan: MR BRAIN W WO CONTRAST  Gait disturbance  Other fatigue   1.   We discussed switching from Rebif to [facility] as she has needle fatigue.  We also discussed that Mavenclad could give her an opportunity to go many years without a constant disease modifying therapy due to its long-term effect on the immune system.  We will check some lab work to make sure she is a candidate and she will give this some more thought. 2.    Continue  zoloft.    3.   Stay active and exercise as tolerated. 4.   Can continue Phentermine for MS fatigue, weight  loss and reduced attention. 5.   She will return to see me in 6 months or sooner if she has new or  worsening neurologic symptoms.   Shaniyah Wix A. Epimenio Foot, MD, PhD 04/20/2023, 12:02 PM Certified in Neurology, Clinical Neurophysiology, Sleep Medicine, Pain Medicine and Neuroimaging  [facility] [address], [address] [place], [place] [address] [phone]

## 2023-04-24 ENCOUNTER — Encounter: Payer: Self-pay | Admitting: Neurology

## 2023-04-24 LAB — COMPREHENSIVE METABOLIC PANEL
ALT: 19 [IU]/L (ref 0–32)
AST: 21 [IU]/L (ref 0–40)
Albumin: 3.9 g/dL (ref 3.8–4.9)
Alkaline Phosphatase: 127 [IU]/L — ABNORMAL HIGH (ref 44–121)
BUN/Creatinine Ratio: 16 (ref 9–23)
BUN: 13 mg/dL (ref 6–24)
Bilirubin Total: 0.3 mg/dL (ref 0.0–1.2)
CO2: 24 mmol/L (ref 20–29)
Calcium: 8.6 mg/dL — ABNORMAL LOW (ref 8.7–10.2)
Chloride: 100 mmol/L (ref 96–106)
Creatinine, Ser: 0.81 mg/dL (ref 0.57–1.00)
Globulin, Total: 3 g/dL (ref 1.5–4.5)
Glucose: 163 mg/dL — ABNORMAL HIGH (ref 70–99)
Potassium: 3.8 mmol/L (ref 3.5–5.2)
Sodium: 137 mmol/L (ref 134–144)
Total Protein: 6.9 g/dL (ref 6.0–8.5)
eGFR: 87 mL/min/{1.73_m2} (ref >59)

## 2023-04-24 LAB — CBC WITH DIFFERENTIAL/PLATELET
Basophils Absolute: 0 10*3/uL (ref 0.0–0.2)
Basos: 1 %
EOS (ABSOLUTE): 0.2 10*3/uL (ref 0.0–0.4)
Eos: 3 %
Hematocrit: 42.1 % (ref 34.0–46.6)
Hemoglobin: 12.6 g/dL (ref 11.1–15.9)
Immature Grans (Abs): 0 10*3/uL (ref 0.0–0.1)
Immature Granulocytes: 0 %
Lymphocytes Absolute: 1.7 10*3/uL (ref 0.7–3.1)
Lymphs: 22 %
MCH: 26 pg — ABNORMAL LOW (ref 26.6–33.0)
MCHC: 29.9 g/dL — ABNORMAL LOW (ref 31.5–35.7)
MCV: 87 fL (ref 79–97)
Monocytes Absolute: 0.4 10*3/uL (ref 0.1–0.9)
Monocytes: 5 %
Neutrophils Absolute: 5.4 10*3/uL (ref 1.4–7.0)
Neutrophils: 69 %
Platelets: 345 10*3/uL (ref 150–450)
RBC: 4.84 x10E6/uL (ref 3.77–5.28)
RDW: 14.2 % (ref 11.7–15.4)
WBC: 7.8 10*3/uL (ref 3.4–10.8)

## 2023-04-24 LAB — HEPATITIS B SURFACE ANTIBODY,QUALITATIVE: Hep B Surface Ab, Qual: NONREACTIVE

## 2023-04-24 LAB — QUANTIFERON-TB GOLD PLUS
QuantiFERON Nil Value: 0.01 [IU]/mL
QuantiFERON TB1 Ag Value: 0.01 [IU]/mL
QuantiFERON TB2 Ag Value: 0.03 [IU]/mL

## 2023-04-24 LAB — HEPATITIS B SURFACE ANTIGEN: Hepatitis B Surface Ag: NEGATIVE

## 2023-04-24 LAB — HIV ANTIBODY (ROUTINE TESTING W REFLEX): HIV Screen 4th Generation wRfx: NONREACTIVE

## 2023-04-24 LAB — VARICELLA ZOSTER ANTIBODY, IGG: Varicella zoster IgG: REACTIVE

## 2023-04-24 LAB — HEPATITIS B CORE ANTIBODY, TOTAL: Hep B Core Total Ab: NEGATIVE

## 2023-05-03 ENCOUNTER — Ambulatory Visit (INDEPENDENT_AMBULATORY_CARE_PROVIDER_SITE_OTHER): Payer: Commercial Managed Care - PPO

## 2023-05-03 DIAGNOSIS — Z79899 Other long term (current) drug therapy: Secondary | ICD-10-CM | POA: Diagnosis not present

## 2023-05-03 DIAGNOSIS — G35 Multiple sclerosis: Secondary | ICD-10-CM

## 2023-05-03 DIAGNOSIS — R200 Anesthesia of skin: Secondary | ICD-10-CM

## 2023-05-03 DIAGNOSIS — G35D Multiple sclerosis, unspecified: Secondary | ICD-10-CM

## 2023-05-03 MED ORDER — GADOBENATE DIMEGLUMINE 529 MG/ML IV SOLN
20.0000 mL | Freq: Once | INTRAVENOUS | Status: AC | PRN
  Administered 2023-05-03: 20 mL via INTRAVENOUS

## 2023-05-04 ENCOUNTER — Telehealth: Payer: Self-pay | Admitting: *Deleted

## 2023-05-04 NOTE — Telephone Encounter (Signed)
Faxed completed/signed Mavenclad start form to [employer] at [phone]. Received fax confirmation.

## 2023-05-04 NOTE — Telephone Encounter (Signed)
Completed PA for Mavenclad via [employer]. Sent to [facility]. Should have a determination within 3-5 business days. Key: [id number].

## 2023-05-09 NOTE — Telephone Encounter (Signed)
PA for [facility] approved by [facility]: "Approved on October 18 by [employer] 2017 NCPDP Request Reference Number: [id number]. MAVENCLAD PAK 10MG (9) is approved through 07/04/2023. Your patient may now fill this prescription and it will be covered. Authorization Expiration Date: 07/04/2023"

## 2023-05-15 NOTE — Telephone Encounter (Signed)
I called [employer] SP. Rebif delivered 10/23. I then spoke with Raynelle Fanning.They started a "Cost Exceed Override" on Friday. It is still in process. They have the PA on file. Can check back in a few days.

## 2023-06-01 ENCOUNTER — Encounter: Payer: Self-pay | Admitting: Neurology

## 2023-06-01 NOTE — Addendum Note (Signed)
Addended by: Geronimo Running A on: 06/01/2023 04:33 PM   Modules accepted: Orders

## 2023-06-06 ENCOUNTER — Other Ambulatory Visit: Payer: Self-pay | Admitting: *Deleted

## 2023-06-06 DIAGNOSIS — G35 Multiple sclerosis: Secondary | ICD-10-CM

## 2023-06-06 DIAGNOSIS — Z79899 Other long term (current) drug therapy: Secondary | ICD-10-CM

## 2023-06-06 NOTE — Telephone Encounter (Signed)
Called pt. Scheduled lab appt for 07/31/23 at 10:30am for repeat labs. She tolerated Mavenclad well last week, no SE. She will call if she has any questions moving forward.   She is scheduled to take Mavenclad Month 2, year 1 06/26/23.

## 2023-06-07 ENCOUNTER — Other Ambulatory Visit (HOSPITAL_BASED_OUTPATIENT_CLINIC_OR_DEPARTMENT_OTHER): Payer: Self-pay | Admitting: Family Medicine

## 2023-06-07 ENCOUNTER — Other Ambulatory Visit: Payer: Self-pay | Admitting: Family Medicine

## 2023-06-07 DIAGNOSIS — Z1231 Encounter for screening mammogram for malignant neoplasm of breast: Secondary | ICD-10-CM

## 2023-06-22 ENCOUNTER — Telehealth: Payer: Self-pay

## 2023-06-22 ENCOUNTER — Other Ambulatory Visit (HOSPITAL_COMMUNITY): Payer: Self-pay

## 2023-06-22 NOTE — Telephone Encounter (Signed)
Pharmacy Patient Advocate Encounter   Received notification from [employer]Meds that prior authorization for Mavenclad (9 Tabs) 10MG  tablets is required/requested.   Insurance verification completed.   The patient is insured through [facility] .   Per test claim: PA required; PA submitted to above mentioned insurance via CoverMyMeds Key/confirmation #/EOC [id number] Status is pending

## 2023-06-26 ENCOUNTER — Encounter (HOSPITAL_BASED_OUTPATIENT_CLINIC_OR_DEPARTMENT_OTHER): Payer: Self-pay

## 2023-06-26 ENCOUNTER — Ambulatory Visit (HOSPITAL_BASED_OUTPATIENT_CLINIC_OR_DEPARTMENT_OTHER)
Admission: RE | Admit: 2023-06-26 | Discharge: 2023-06-26 | Disposition: A | Discharge status: Home / Self Care | Payer: Commercial Managed Care - PPO | Source: Ambulatory Visit | Attending: Family Medicine | Admitting: Family Medicine

## 2023-06-26 DIAGNOSIS — Z1231 Encounter for screening mammogram for malignant neoplasm of breast: Secondary | ICD-10-CM | POA: Diagnosis present

## 2023-07-03 NOTE — Telephone Encounter (Addendum)
I called pt to confirm that she had taken [facility]  since I received approval for it from our PA team.   She said she took 2nd month, no SE.  06-25-2023 thru 06-30-2023.  Next lab check [phone] and then appt with Dr. Epimenio Foot 08-22-2023.

## 2023-07-03 NOTE — Telephone Encounter (Signed)
Pharmacy Patient Advocate Encounter  Received notification from [facility] that Prior Authorization for Mavenclad (9 Tabs) 10MG  tablets has been APPROVED from 06-24-2023 to 08-23-2023   PA #/Case ID/Reference #: [id number]

## 2023-07-04 ENCOUNTER — Telehealth: Payer: Self-pay | Admitting: *Deleted

## 2023-07-04 NOTE — Telephone Encounter (Signed)
Received note that [facility] nurse visit completed 06-29-2023,  Year 1 month 1 start date 05-29-2023 Year 1 month 2 start date 06-29-2023  Counseled [phone].   Ward Chatters RN BSN [facility] [phone]

## 2023-07-18 ENCOUNTER — Ambulatory Visit: Payer: Commercial Managed Care - PPO

## 2023-07-31 ENCOUNTER — Other Ambulatory Visit (INDEPENDENT_AMBULATORY_CARE_PROVIDER_SITE_OTHER): Payer: Commercial Managed Care - PPO

## 2023-07-31 DIAGNOSIS — Z79899 Other long term (current) drug therapy: Secondary | ICD-10-CM

## 2023-07-31 DIAGNOSIS — Z0289 Encounter for other administrative examinations: Secondary | ICD-10-CM

## 2023-07-31 DIAGNOSIS — G35 Multiple sclerosis: Secondary | ICD-10-CM

## 2023-08-01 ENCOUNTER — Encounter: Payer: Self-pay | Admitting: Neurology

## 2023-08-01 LAB — CBC WITH DIFFERENTIAL/PLATELET
Basophils Absolute: 0 10*3/uL (ref 0.0–0.2)
Basos: 1 %
EOS (ABSOLUTE): 0.3 10*3/uL (ref 0.0–0.4)
Eos: 5 %
Hematocrit: 40.8 % (ref 34.0–46.6)
Hemoglobin: 12.8 g/dL (ref 11.1–15.9)
Immature Grans (Abs): 0 10*3/uL (ref 0.0–0.1)
Immature Granulocytes: 0 %
Lymphocytes Absolute: 0.4 10*3/uL — ABNORMAL LOW (ref 0.7–3.1)
Lymphs: 7 %
MCH: 27.5 pg (ref 26.6–33.0)
MCHC: 31.4 g/dL — ABNORMAL LOW (ref 31.5–35.7)
MCV: 88 fL (ref 79–97)
Monocytes Absolute: 0.3 10*3/uL (ref 0.1–0.9)
Monocytes: 6 %
Neutrophils Absolute: 4.8 10*3/uL (ref 1.4–7.0)
Neutrophils: 81 %
Platelets: 288 10*3/uL (ref 150–450)
RBC: 4.66 x10E6/uL (ref 3.77–5.28)
RDW: 16.2 % — ABNORMAL HIGH (ref 11.7–15.4)
WBC: 5.8 10*3/uL (ref 3.4–10.8)

## 2023-08-01 LAB — HEPATIC FUNCTION PANEL
ALT: 27 [IU]/L (ref 0–32)
AST: 21 [IU]/L (ref 0–40)
Albumin: 4.2 g/dL (ref 3.8–4.9)
Alkaline Phosphatase: 157 [IU]/L — ABNORMAL HIGH (ref 44–121)
Bilirubin Total: 0.4 mg/dL (ref 0.0–1.2)
Bilirubin, Direct: 0.13 mg/dL (ref 0.00–0.40)
Total Protein: 7.3 g/dL (ref 6.0–8.5)

## 2023-08-22 ENCOUNTER — Encounter: Payer: Self-pay | Admitting: Neurology

## 2023-08-22 ENCOUNTER — Ambulatory Visit: Payer: Commercial Managed Care - PPO | Admitting: Neurology

## 2023-08-22 VITALS — BP 121/82 | HR 92 | Ht 65.0 in | Wt 208.0 lb

## 2023-08-22 DIAGNOSIS — G35 Multiple sclerosis: Secondary | ICD-10-CM | POA: Diagnosis not present

## 2023-08-22 DIAGNOSIS — R5383 Other fatigue: Secondary | ICD-10-CM | POA: Diagnosis not present

## 2023-08-22 DIAGNOSIS — R269 Unspecified abnormalities of gait and mobility: Secondary | ICD-10-CM

## 2023-08-22 DIAGNOSIS — R7989 Other specified abnormal findings of blood chemistry: Secondary | ICD-10-CM

## 2023-08-22 DIAGNOSIS — Z79899 Other long term (current) drug therapy: Secondary | ICD-10-CM | POA: Diagnosis not present

## 2023-08-22 DIAGNOSIS — F32A Depression, unspecified: Secondary | ICD-10-CM

## 2023-08-22 NOTE — Progress Notes (Signed)
 [facility]  PATIENT: Denise Franklin DOB: Jun 07, 1971  REFERRING DOCTOR OR PCP:  Stephane Jumbo SOURCE: patient  _________________________________   HISTORICAL  CHIEF COMPLAINT:  Chief Complaint  Patient presents with   Room 10    Pt is here Alone. Pt states that she has been doing well since her last Appointment.     HISTORY OF PRESENT ILLNESS:  Denise Franklin is a 53 y.o. woman with relapsing remitting MS.     Update 08/22/2023 Her MS is doing well with no exacerbations or new neurologic symptoms. She switched to [facility] from Rebif  and tolerates it well.    She did the first 2 months November and December 2024.   Lymphocytes were 0.4 07/31/2023.   LFTs ok (AlkP was elevated)  Gait is doing well.   Balance and coordination are good.  No falls.   Due to mild reduced balance, she will hold the bannister going downstairs sometimes but not always. Strength and sensation are fine.  She saw ophthalmology and is stable.       Mood is doing well..     She has some insomnia.     She feels she has mild cognitive issues, mostly word finding and mild audible processing.   Phentermine  helps the MS fatigue and focusing some.  She continues on Vit D for deficiency.       MS History:   She presented with left arm dysesthesias that spread to the face in June 2012. She then had an MRI of the brain that was consistent with multiple sclerosis and she was diagnosed. She started on Rebif  in September 2012. She continues on Rebif  and generally has tolerated it well. She has had some skin reactions but they have not been severe.  Follow-up MRI in 2013 and 2014 both showed that there was a stable pattern of demyelinating foci. There were no new lesions.    In retrospect in 2004, after a child was born she had 2 weeks of reduced balance and reduced sensation in her hands.   She completely recovered and did not have any imaging.      MRI brain 08/20/2018 shows multiple  T2/flair hyperintense foci in the periventricular, juxtacortical and deep white matter of both hemispheres and in the right thalamus in a pattern and configuration consistent with chronic demyelinating plaque associated with multiple sclerosis.  None of the foci appears to be acute.  When compared to the previous MRI dated 09/18/2015, there is no interval change.  There is a normal enhancement pattern and there are no acute findings.  MRI brain 08/24/2021 showed  Multiple T2/FLAIR hyperintense foci in the hemispheres and one focus in the right thalamus in a pattern consistent with chronic demyelinating plaque associated with multiple sclerosis.  None of the foci appear to be acute.  Compared to the MRI from 08/20/2018, there were no new lesions.  MRI cervical spine showed  Foci within the spinal cord adjacent to C2, C2-C3 and C3-C4 as detailed above.  These are consistent with chronic demyelinating plaque associated with multiple sclerosis.     Multilevel degenerative changes as detailed above leads to various degrees of foraminal narrowing from C3-C4 through C7-T1.  At C4-C5 there is moderately severe right foraminal narrowing with potential for right C5 nerve root compression.  There does not appear to be nerve root compression at other levels.  There is no spinal stenosis.  REVIEW OF SYSTEMS: Constitutional: No fevers, chills, sweats, or change in appetite.  Notes  Fatigue and insomnia Eyes: No visual changes, double vision, eye pain Ear, nose and throat: No hearing loss, ear pain, nasal congestion, sore throat Cardiovascular: No chest pain, palpitations Respiratory:  No shortness of breath at rest or with exertion.   No wheezes GastrointestinaI: No nausea, vomiting, diarrhea, abdominal pain, fecal incontinence Genitourinary:  No dysuria, urinary retention or frequency.  No nocturia. Musculoskeletal:  No neck pain, back pain Integumentary: No rash, pruritus, skin lesions Neurological: as  above Psychiatric: Much more anxiety and agitation.  Some depression Endocrine: No palpitations, diaphoresis, change in appetite, change in weigh or increased thirst Hematologic/Lymphatic:  No anemia, purpura, petechiae. Allergic/Immunologic: No itchy/runny eyes, nasal congestion, recent allergic reactions, rashes   ALLERGIES: No Known Allergies  HOME MEDICATIONS:  Current Outpatient Medications:    cholecalciferol (VITAMIN D ) 1000 UNITS tablet, Take 2,000 Units by mouth daily. Reported on 08/18/2015, Disp: , Rfl:    Cladribine, 9 Tabs, (MAVENCLAD, 9 TABS,) 10 MG TBPK, Take by mouth. Month 1: Take 2 tablets by mouth days 1-4. Take 1 tablet by mouth on day five. Month 2: Take 2 tablets by mouth days 1-3. Take 1 tablet by mouth days 4-5., Disp: , Rfl:    Ferrous Sulfate (IRON PO), Take 1 tablet by mouth daily., Disp: , Rfl:    Multiple Vitamin (MULTIVITAMIN) tablet, Take 1 tablet by mouth daily., Disp: , Rfl:    Omega-3 Fatty Acids (FISH OIL) 1000 MG CAPS, Take 1,000 each by mouth daily., Disp: , Rfl:    phentermine  37.5 MG capsule, TAKE 1 CAPSULE BY MOUTH EVERY DAY, Disp: 30 capsule, Rfl: 5   sertraline  (ZOLOFT ) 100 MG tablet, TAKE 1 TABLET BY MOUTH EVERY DAY, Disp: 90 tablet, Rfl: 2   ALPRAZolam  (XANAX ) 0.5 MG tablet, TAKE 1 TABLET BY MOUTH DAILY AS NEEDED FOR PANIC ATTACK (Patient not taking: Reported on 08/22/2023), Disp: 20 tablet, Rfl: 2   ibuprofen (ADVIL,MOTRIN) 400 MG tablet, Take 400 mg by mouth as needed. (Patient not taking: Reported on 08/22/2023), Disp: , Rfl:   PAST MEDICAL HISTORY: Past Medical History:  Diagnosis Date   Infertility, female    Multiple sclerosis (HCC)    Vision abnormalities     PAST SURGICAL HISTORY: Past Surgical History:  Procedure Laterality Date   EYE SURGERY Right    LAPAROSCOPIC APPENDECTOMY     TRIGGER FINGER RELEASE Left 04/2023    FAMILY HISTORY: Family History  Problem Relation Age of Onset   Breast cancer Mother 48       Stage IV    Kidney failure Father    Diabetes type II Father    Breast cancer Sister 20       DCIS   Melanoma Maternal Aunt 51       mets to brain, died at 15   Lung cancer Maternal Uncle 72       smoker   Brain cancer Maternal Grandmother 25       unknown primary cancer   Colon cancer Maternal Grandfather 23   Breast cancer Maternal Aunt 50       second cancer, 65    SOCIAL HISTORY:  Social History   Socioeconomic History   Marital status: Married    Spouse name: Shilee Biggs   Number of children: Not on file   Years of education: 16   Highest education level: Bachelor's degree (e.g., BA, AB, BS)  Occupational History   Not on file  Tobacco Use   Smoking status: Never   Smokeless tobacco: Never  Substance and Sexual Activity   Alcohol use: No    Alcohol/week: 0.0 standard drinks of alcohol   Drug use: No   Sexual activity: Not on file  Other Topics Concern   Not on file  Social History Narrative   Right handed   Caffeine use: very minimal   Social Drivers of Corporate Investment Banker Strain: Not on file  Food Insecurity: Not on file  Transportation Needs: Not on file  Physical Activity: Not on file  Stress: Not on file  Social Connections: Unknown (11/24/2021)   [facility]   Social Network    Social Network: Not on file  Intimate Partner Violence: Unknown (10/21/2021)   Received from [employer] [facility], [facility]   HITS    Physically Hurt: Not on file    Insult or Talk Down To: Not on file    Threaten Physical Harm: Not on file    Scream or Curse: Not on file     PHYSICAL EXAM  Vitals:   08/22/23 1134  BP: 121/82  Pulse: 92  Weight: 208 lb (94.3 kg)  Height: 5' 5 (1.651 m)    Body mass index is 34.61 kg/m.   General: The patient is well-developed and well-nourished and in no acute distress    Neurologic Exam  Mental status: The patient is alert and oriented x 3 at the time of the examination. The patient has  apparent normal recent and remote memory, with an apparently normal attention span and concentration ability.   Speech is normal.  Cranial nerves: She has a left greater than right alternating exotropia.   Facial strength and sensation is normal. Trapezius strength is normal.  No obvious hearing deficits are noted.  Motor:  Muscle bulk is normal.   Tone is normal. Strength is  5 / 5 in all 4 extremities except 4+/5 left APB strength.  Sensory: Sensory testing is intact to touch and vibration in the arms and legs.  Coordination: Cerebellar testing shows good finger nose finger and heel to shin.   Gait and station: Station is normal.   Gait is normal though tndem is mildly wide.  Romberg is negative.  Reflexes: Deep tendon reflexes are symmetric and normal bilaterally.         ASSESSMENT AND PLAN  Multiple sclerosis (HCC) - Plan: CBC with Differential/Platelet, Comprehensive metabolic panel  High risk medication use - Plan: CBC with Differential/Platelet, Comprehensive metabolic panel  Gait disturbance  Other fatigue  Depression, unspecified depression type  Low serum vitamin D    1.   Continue Mavenclad.  Check labs in May or June and f/u in 8 months to repeat chronic infection and other labs.  She will be due for the second year around October November 2025 2.   Continue  zoloft .     Continue Vit D 3.   Stay active and exercise as tolerated. 4.   Can continue Phentermine  for MS fatigue, weight loss and reduced attention.   She does not need to take daily.  5.   If CTS symptoms worsen can check NCV/EMG and consider surgery.    She will return to see me in 8 months or sooner if she has new or worsening neurologic symptoms.   Reginald Mangels A. Vear, MD, PhD 08/22/2023, 11:57 AM Certified in Neurology, Clinical Neurophysiology, Sleep Medicine, Pain Medicine and Neuroimaging  [facility] [address] [place], [place] [address] [phone]

## 2023-10-27 ENCOUNTER — Other Ambulatory Visit: Payer: Self-pay | Admitting: Neurology

## 2023-10-31 NOTE — Telephone Encounter (Signed)
 Last seen on 08/22/23 Follow up scheduled on 04/22/24

## 2023-11-29 ENCOUNTER — Other Ambulatory Visit: Payer: Self-pay | Admitting: Neurology

## 2023-11-29 NOTE — Telephone Encounter (Signed)
 Last seen on 08/22/23 Follow up scheduled on 04/22/24   Dispensed Days Supply Quantity Provider Pharmacy  PHENTERMINE  37.5 MG CAPSULE 09/06/2023 30 30 each Jorie Newness, MD [facility] [phone] IN [facility]       Rx pending to be signed

## 2024-04-10 ENCOUNTER — Other Ambulatory Visit: Payer: Self-pay | Admitting: Medical Genetics

## 2024-04-22 ENCOUNTER — Encounter: Payer: Self-pay | Admitting: Neurology

## 2024-04-22 ENCOUNTER — Encounter: Payer: Self-pay | Admitting: *Deleted

## 2024-04-22 ENCOUNTER — Ambulatory Visit: Payer: Commercial Managed Care - PPO | Admitting: Neurology

## 2024-04-22 VITALS — BP 133/89 | HR 83 | Resp 15 | Ht 65.0 in | Wt 209.0 lb

## 2024-04-22 DIAGNOSIS — R7989 Other specified abnormal findings of blood chemistry: Secondary | ICD-10-CM | POA: Diagnosis not present

## 2024-04-22 DIAGNOSIS — R269 Unspecified abnormalities of gait and mobility: Secondary | ICD-10-CM

## 2024-04-22 DIAGNOSIS — G35D Multiple sclerosis, unspecified: Secondary | ICD-10-CM

## 2024-04-22 DIAGNOSIS — F32A Depression, unspecified: Secondary | ICD-10-CM

## 2024-04-22 DIAGNOSIS — Z79899 Other long term (current) drug therapy: Secondary | ICD-10-CM

## 2024-04-22 DIAGNOSIS — R5383 Other fatigue: Secondary | ICD-10-CM

## 2024-04-22 NOTE — Progress Notes (Signed)
 [facility]  PATIENT: Denise Franklin DOB: 1970/11/19  REFERRING DOCTOR OR PCP:  Stephane Jumbo SOURCE: patient  _________________________________   HISTORICAL  CHIEF COMPLAINT:  Chief Complaint  Patient presents with   Multiple Sclerosis    Rm10, alone, Appt for updated visit/labs prior to starting Y2 Mavenclad. Start form in folder to complete:pt stated that she has gait issues and fatigue     HISTORY OF PRESENT ILLNESS:  Denise Franklin is a 53 y.o. woman with relapsing remitting MS.     Update 04/22/2024 Her MS is doing well with no exacerbations or new neurologic symptoms. She switched to Mavenclad from Rebif  and tolerates it well.    She did the first 2 months November and December 2024.   Lymphocytes were 0.4 07/31/2023.   LFTs ok (AlkP was elevated).   Compared to last year, she feels a little better.    Gait is doing well.   Balance and coordination are good.  No falls.   Due to mild reduced balance, she will hold the bannister going downstairs sometimes but not always. Strength and sensation are fine.  She saw ophthalmology and is stable.       She has fatigue.  Mood is doing well..     She has some insomnia.     She feels she has mild cognitive issues, mostly word finding and mild audible processing - same  or better compared to last year. .   Phentermine  helps the MS fatigue and focusing some.  She was recently diagnosed with T2NIDDM and is controlling by diet.    She continues on Vit D for deficiency.       MS History:   She presented with left arm dysesthesias that spread to the face in June 2012. She then had an MRI of the brain that was consistent with multiple sclerosis and she was diagnosed. She started on Rebif  in September 2012. She continues on Rebif  and generally has tolerated it well. She has had some skin reactions but they have not been severe.  Follow-up MRI in 2013 and 2014 both showed that there was a stable pattern of  demyelinating foci. There were no new lesions.    In retrospect in 2004, after a child was born she had 2 weeks of reduced balance and reduced sensation in her hands.   She completely recovered and did not have any imaging.      MRI brain 08/20/2018 shows multiple T2/flair hyperintense foci in the periventricular, juxtacortical and deep white matter of both hemispheres and in the right thalamus in a pattern and configuration consistent with chronic demyelinating plaque associated with multiple sclerosis.  None of the foci appears to be acute.  When compared to the previous MRI dated 09/18/2015, there is no interval change.  There is a normal enhancement pattern and there are no acute findings.  MRI brain 08/24/2021 showed  Multiple T2/FLAIR hyperintense foci in the hemispheres and one focus in the right thalamus in a pattern consistent with chronic demyelinating plaque associated with multiple sclerosis.  None of the foci appear to be acute.  Compared to the MRI from 08/20/2018, there were no new lesions.  MRI cervical spine showed  Foci within the spinal cord adjacent to C2, C2-C3 and C3-C4 as detailed above.  These are consistent with chronic demyelinating plaque associated with multiple sclerosis.     Multilevel degenerative changes as detailed above leads to various degrees of foraminal narrowing from C3-C4 through C7-T1.  At  C4-C5 there is moderately severe right foraminal narrowing with potential for right C5 nerve root compression.  There does not appear to be nerve root compression at other levels.  There is no spinal stenosis.  REVIEW OF SYSTEMS: Constitutional: No fevers, chills, sweats, or change in appetite.  Notes  Fatigue and insomnia Eyes: No visual changes, double vision, eye pain Ear, nose and throat: No hearing loss, ear pain, nasal congestion, sore throat Cardiovascular: No chest pain, palpitations Respiratory:  No shortness of breath at rest or with exertion.   No wheezes GastrointestinaI:  No nausea, vomiting, diarrhea, abdominal pain, fecal incontinence Genitourinary:  No dysuria, urinary retention or frequency.  No nocturia. Musculoskeletal:  No neck pain, back pain Integumentary: No rash, pruritus, skin lesions Neurological: as above Psychiatric: Much more anxiety and agitation.  Some depression Endocrine: No palpitations, diaphoresis, change in appetite, change in weigh or increased thirst Hematologic/Lymphatic:  No anemia, purpura, petechiae. Allergic/Immunologic: No itchy/runny eyes, nasal congestion, recent allergic reactions, rashes   ALLERGIES: No Known Allergies  HOME MEDICATIONS:  Current Outpatient Medications:    ALPRAZolam  (XANAX ) 0.5 MG tablet, TAKE 1 TABLET BY MOUTH DAILY AS NEEDED FOR PANIC ATTACK, Disp: 20 tablet, Rfl: 2   cholecalciferol (VITAMIN D ) 1000 UNITS tablet, Take 2,000 Units by mouth daily. Reported on 08/18/2015, Disp: , Rfl:    Cladribine, 9 Tabs, (MAVENCLAD, 9 TABS,) 10 MG TBPK, Take by mouth. Month 1: Take 2 tablets by mouth days 1-4. Take 1 tablet by mouth on day five. Month 2: Take 2 tablets by mouth days 1-3. Take 1 tablet by mouth days 4-5., Disp: , Rfl:    Ferrous Sulfate (IRON PO), Take 1 tablet by mouth daily., Disp: , Rfl:    ibuprofen (ADVIL,MOTRIN) 400 MG tablet, Take 400 mg by mouth as needed., Disp: , Rfl:    Multiple Vitamin (MULTIVITAMIN) tablet, Take 1 tablet by mouth daily., Disp: , Rfl:    Omega-3 Fatty Acids (FISH OIL) 1000 MG CAPS, Take 1,000 each by mouth daily., Disp: , Rfl:    phentermine  37.5 MG capsule, TAKE 1 CAPSULE BY MOUTH EVERY DAY, Disp: 30 capsule, Rfl: 5   sertraline  (ZOLOFT ) 100 MG tablet, TAKE 1 TABLET BY MOUTH EVERY DAY, Disp: 90 tablet, Rfl: 2  PAST MEDICAL HISTORY: Past Medical History:  Diagnosis Date   Diabetes type 2 (HCC)    Infertility, female    Multiple sclerosis    Vision abnormalities     PAST SURGICAL HISTORY: Past Surgical History:  Procedure Laterality Date   EYE SURGERY Right     LAPAROSCOPIC APPENDECTOMY     TRIGGER FINGER RELEASE Left 04/2023    FAMILY HISTORY: Family History  Problem Relation Age of Onset   Breast cancer Mother 33       Stage IV   Kidney failure Father    Diabetes type II Father    Breast cancer Sister 30       DCIS   Melanoma Maternal Aunt 51       mets to brain, died at 74   Lung cancer Maternal Uncle 31       smoker   Brain cancer Maternal Grandmother 35       unknown primary cancer   Colon cancer Maternal Grandfather 67   Breast cancer Maternal Aunt 50       second cancer, 65    SOCIAL HISTORY:  Social History   Socioeconomic History   Marital status: Married    Spouse name: Aariana Shankland  Number of children: 2   Years of education: 16   Highest education level: Bachelor's degree (e.g., BA, AB, BS)  Occupational History   Not on file  Tobacco Use   Smoking status: Never    Passive exposure: Never   Smokeless tobacco: Never  Vaping Use   Vaping status: Never Used  Substance and Sexual Activity   Alcohol use: Not Currently   Drug use: No   Sexual activity: Yes    Birth control/protection: None  Other Topics Concern   Not on file  Social History Narrative   Right handed   Caffeine use: very minimal   Social Drivers of Corporate investment banker Strain: Low Risk  (04/05/2024)   Received from [facility]   Overall Financial Resource Strain (CARDIA)    How hard is it for you to pay for the very basics like food, housing, medical care, and heating?: Not hard at all  Food Insecurity: No Food Insecurity (04/05/2024)   Received from [facility]   Hunger Vital Sign    Within the past 12 months, you worried that your food would run out before you got the money to buy more.: Never true    Within the past 12 months, the food you bought just didn't last and you didn't have money to get more.: Never true  Transportation Needs: No Transportation Needs (04/05/2024)   Received from [facility] -  Transportation    In the past 12 months, has lack of transportation kept you from medical appointments or from getting medications?: No    In the past 12 months, has lack of transportation kept you from meetings, work, or from getting things needed for daily living?: No  Physical Activity: Insufficiently Active (04/05/2024)   Received [facility]   Exercise Vital Sign    On average, how many days per week do you engage in moderate to strenuous exercise (like a brisk walk)?: 3 days    On average, how many minutes do you engage in exercise at this level?: 30 min  Stress: No Stress Concern Present (04/05/2024)   Received from [facility] - Occupational Stress Questionnaire    Do you feel stress - tense, restless, nervous, or anxious, or unable to sleep at night because your mind is troubled all the time - these days?: Only a little  Social Connections: Moderately Integrated (04/05/2024)   Received from [facility]   Social Network    How would you rate your social network (family, work, friends)?: Adequate participation with social networks  Intimate Partner Violence: Not At Risk (04/05/2024)   Received from [facility]   HITS    Over the last 12 months how often did your partner physically hurt you?: Never    Over the last 12 months how often did your partner insult you or talk down to you?: Never    Over the last 12 months how often did your partner threaten you with physical harm?: Never    Over the last 12 months how often did your partner scream or curse at you?: Never     PHYSICAL EXAM  Vitals:   04/22/24 1128  BP: 133/89  Pulse: 83  Resp: 15  SpO2: 97%  Weight: 209 lb (94.8 kg)  Height: 5' 5 (1.651 m)    Body mass index is 34.78 kg/m.   General: The patient is well-developed and well-nourished and in no acute distress  Neurologic Exam  Mental status: The patient is alert and oriented x 3 at the time of the  examination. The patient has apparent normal recent and remote memory, with an apparently normal attention span and concentration ability.   Speech is normal.  Cranial nerves: She has a left greater than right alternating exotropia.   Facial strength and sensation is normal. Trapezius strength is normal.  No obvious hearing deficits are noted.  Motor:  Muscle bulk is normal.   Tone is normal. Strength is  5 / 5 in all 4 extremities except 4+/5 left APB strength.  Sensory: Sensory testing is intact to touch and vibration in the arms and legs.  Coordination: Cerebellar testing shows good finger nose finger and heel to shin.   Gait and station: Station is normal.   The gait was fairly normal.  Tandem gait was wide..  Romberg is negative.  Reflexes: Deep tendon reflexes are symmetric and normal bilaterally.         ASSESSMENT AND PLAN  Multiple sclerosis - Plan: MR BRAIN W WO CONTRAST, Hepatitis B surface antigen, HIV Antibody (routine testing w rflx), QuantiFERON-TB Gold Plus, CBC with Differential/Platelet, Comprehensive metabolic panel with GFR, Hepatitis B core antibody, total, Hepatitis C antibody  High risk medication use - Plan: MR BRAIN W WO CONTRAST, Hepatitis B surface antigen, HIV Antibody (routine testing w rflx), QuantiFERON-TB Gold Plus, CBC with Differential/Platelet, Comprehensive metabolic panel with GFR, Hepatitis B core antibody, total, Hepatitis C antibody  Other fatigue  Low serum vitamin D   Depression, unspecified depression type  Gait disturbance   1.   Continue Mavenclad.  She is due for her second year of Mavenclad and we will check the chronic infectious disease labs and other labs.  If the lymphocyte count is low we can wait a couple months and recheck in 2-3 more months.  We will also check an MRI of the brain to determine if there is any breakthrough activity and consider a different DMT if this is occurring 2.   Continue  zoloft .     Continue Vit D 3.    Stay active and exercise as tolerated. 4.   Can continue Phentermine  for MS fatigue, weight loss and reduced attention.   She does not need to take daily.  5.  She will return to see me in 6 months or sooner if she has new or worsening neurologic symptoms.   Zalmen Wrightsman A. Vear, MD, PhD 04/22/2024, 4:40 PM Certified in Neurology, Clinical Neurophysiology, Sleep Medicine, Pain Medicine and Neuroimaging  [facility] [address], [address] [place], [place] [address] [phone]

## 2024-04-25 ENCOUNTER — Ambulatory Visit: Payer: Self-pay | Admitting: Neurology

## 2024-04-25 ENCOUNTER — Telehealth: Payer: Self-pay | Admitting: *Deleted

## 2024-04-25 LAB — CBC WITH DIFFERENTIAL/PLATELET
Basophils Absolute: 0 x10E3/uL (ref 0.0–0.2)
Basos: 1 %
EOS (ABSOLUTE): 0.1 x10E3/uL (ref 0.0–0.4)
Eos: 2 %
Hematocrit: 41.4 % (ref 34.0–46.6)
Hemoglobin: 12.8 g/dL (ref 11.1–15.9)
Immature Grans (Abs): 0 x10E3/uL (ref 0.0–0.1)
Immature Granulocytes: 0 %
Lymphocytes Absolute: 0.8 x10E3/uL (ref 0.7–3.1)
Lymphs: 12 %
MCH: 27.3 pg (ref 26.6–33.0)
MCHC: 30.9 g/dL — ABNORMAL LOW (ref 31.5–35.7)
MCV: 88 fL (ref 79–97)
Monocytes Absolute: 0.4 x10E3/uL (ref 0.1–0.9)
Monocytes: 6 %
Neutrophils Absolute: 5.1 x10E3/uL (ref 1.4–7.0)
Neutrophils: 79 %
Platelets: 246 x10E3/uL (ref 150–450)
RBC: 4.69 x10E6/uL (ref 3.77–5.28)
RDW: 14.5 % (ref 11.7–15.4)
WBC: 6.5 x10E3/uL (ref 3.4–10.8)

## 2024-04-25 LAB — COMPREHENSIVE METABOLIC PANEL WITH GFR
ALT: 15 IU/L (ref 0–32)
AST: 12 IU/L (ref 0–40)
Albumin: 4.1 g/dL (ref 3.8–4.9)
Alkaline Phosphatase: 123 IU/L (ref 49–135)
BUN/Creatinine Ratio: 28 — ABNORMAL HIGH (ref 9–23)
BUN: 19 mg/dL (ref 6–24)
Bilirubin Total: 0.3 mg/dL (ref 0.0–1.2)
CO2: 22 mmol/L (ref 20–29)
Calcium: 8.9 mg/dL (ref 8.7–10.2)
Chloride: 99 mmol/L (ref 96–106)
Creatinine, Ser: 0.69 mg/dL (ref 0.57–1.00)
Globulin, Total: 2.6 g/dL (ref 1.5–4.5)
Glucose: 95 mg/dL (ref 70–99)
Potassium: 4.1 mmol/L (ref 3.5–5.2)
Sodium: 136 mmol/L (ref 134–144)
Total Protein: 6.7 g/dL (ref 6.0–8.5)
eGFR: 103 mL/min/1.73 (ref >59)

## 2024-04-25 LAB — QUANTIFERON-TB GOLD PLUS
QuantiFERON Mitogen Value: 3.78 [IU]/mL
QuantiFERON Nil Value: 0.04 [IU]/mL
QuantiFERON TB1 Ag Value: 0.05 [IU]/mL
QuantiFERON TB2 Ag Value: 0.04 [IU]/mL

## 2024-04-25 LAB — HEPATITIS B CORE ANTIBODY, TOTAL: Hep B Core Total Ab: NEGATIVE

## 2024-04-25 LAB — HIV ANTIBODY (ROUTINE TESTING W REFLEX): HIV Screen 4th Generation wRfx: NONREACTIVE

## 2024-04-25 LAB — HEPATITIS B SURFACE ANTIGEN: Hepatitis B Surface Ag: NEGATIVE

## 2024-04-25 LAB — HEPATITIS C ANTIBODY: Hep C Virus Ab: NONREACTIVE

## 2024-04-25 NOTE — Telephone Encounter (Signed)
Faxed completed/signed Mavenclad start form to [employer] at [phone]. Received fax confirmation.

## 2024-04-25 NOTE — Telephone Encounter (Signed)
-----   Message from Charlie DELENA Crete sent at 04/25/2024 12:41 PM EDT ----- The lab work looks good for year 2 of Mavenclad.  I believe she signed the form last week. ----- Message ----- From: Interface, [facility] Lab Results In Sent: 04/23/2024   7:37 AM EDT To: Charlie DELENA Crete, MD

## 2024-04-25 NOTE — Telephone Encounter (Signed)
 Completed PA via [employer]. Prior Auth (EOC) ID:  [id number]. Should have a determination within 3-5 business days.

## 2024-04-30 NOTE — Telephone Encounter (Signed)
 I have informed Javie at [facility] of this approval.

## 2024-05-08 ENCOUNTER — Ambulatory Visit

## 2024-05-08 DIAGNOSIS — Z79899 Other long term (current) drug therapy: Secondary | ICD-10-CM | POA: Diagnosis not present

## 2024-05-08 DIAGNOSIS — G35D Multiple sclerosis, unspecified: Secondary | ICD-10-CM

## 2024-05-08 MED ORDER — GADOBENATE DIMEGLUMINE 529 MG/ML IV SOLN
20.0000 mL | Freq: Once | INTRAVENOUS | Status: AC | PRN
  Administered 2024-05-08: 20 mL via INTRAVENOUS

## 2024-05-09 ENCOUNTER — Encounter: Payer: Self-pay | Admitting: Neurology

## 2024-05-09 NOTE — Telephone Encounter (Signed)
 I called patient to discuss if she has received her Mavenclad.  No answer, left a voicemail asking her to call us back.

## 2024-05-13 NOTE — Telephone Encounter (Signed)
 Pt returned call and stated that it will be delivered tomorrow on the 28th and she is planning on starting it on Wed. Please advise.

## 2024-05-13 NOTE — Telephone Encounter (Signed)
 I called patient again to discuss if she has received her Mavenclad.  No answer, left a voicemail asking her to call us  back.  If patient returns my call, please ask her if she has started her Mavenclad.  If she has, please ask her what the date she started.  Please make sure she is tolerating the Mavenclad well.  If there are questions or concerns please let me know.

## 2024-05-13 NOTE — Telephone Encounter (Signed)
 Pt returned call. Please call back when available.

## 2024-05-30 ENCOUNTER — Other Ambulatory Visit: Payer: Self-pay | Admitting: Physician Assistant

## 2024-05-30 DIAGNOSIS — Z1231 Encounter for screening mammogram for malignant neoplasm of breast: Secondary | ICD-10-CM

## 2024-06-07 ENCOUNTER — Telehealth: Payer: Self-pay | Admitting: Neurology

## 2024-06-07 NOTE — Telephone Encounter (Signed)
 Pt called wanting to know if the PA will be received for her Mavenclad before the Holiday's since she is to start it soon. Please advise.

## 2024-06-10 NOTE — Telephone Encounter (Signed)
 Called t and informed her. Pt was very appreciative. Pt will be calling to schedule delivery.

## 2024-06-10 NOTE — Telephone Encounter (Signed)
 [facility] called stating that they keep running Pt medication  through and Pt  PA  keep getting denied . Informed [facility] of  That the Pa was already approve. [facility] requested to speak to Nurse about PA   Medication:Mavenclad   Callback # [phone] option 2  Plain Number [phone]

## 2024-06-11 NOTE — Telephone Encounter (Signed)
 I called [employer]. Spoke with [employer]. She ran a claim today for Mavenclad. The PA for Mavenclad (9 tablets) covers the Mavenclad 8 tablets as well. They need to run the claim using NDC: [id number]. I was told NOT to complete a PA for Mavenclad 8 tablets even though that is what she needs since the current PA covers it. We can call [phone] if [facility] still has issues.  I called [facility]. I spoke with Kara. They cannot run the claim using that NDC since the RX is for 8 tablets. I asked her to call [employer] to discuss this further. We spoke with Shay. Claris reports that we must submit a PA for Mavenclad 8 tablets. She can see this is in progress. She will mark it as urgent which will receive a determination within 1-3 business days. Saddie from [place] will check the status of this PA later today.  I called patient to discuss. No answer, left a message asking her to call us  back. If patient calls back, please advise her that an urgent PA for Mavenclad 8 tablets was completed today. There was an issue with the RX this month being 8 tablets instead of 9 tablets. We originally were advised by her insurance that a PA for 8 tablets was NOT needed but this was incorrect. We should have a determination within 1-3 business days and that [facility] will also be following along this case. [facility] said they would check on this status later today as well.

## 2024-06-11 NOTE — Telephone Encounter (Addendum)
 I attempted a Mavenclad PA for 8 tablets via [employer]. Prior Auth (EOC) ID:  [id number]. An error occurred after submission and I was prompted to call [employer] but I see that the PA is still in progress.

## 2024-06-11 NOTE — Telephone Encounter (Signed)
 I called [facility].  I spoke with Jamie at the [facility] services department.  They cannot find a PA on file through [employer].  I provided this information to them again.They have been unable to get a paid claim from [employer] still. I was transferred to the PA team. I spoke with Sha'quille. She reports that they cannot find a PA approval from [employer] on file. I provided this information again. She reports that [facility] cannot attach [employer] PA approval to this case. She suggested that I call [employer] to fix this. I advised her that this seems to be an internal issue with [facility] and [employer] and I don't think I will be able to help. I was transferred to [employer] anyways. I spoke with a rep at [employer] and [facility] is running the claim using the wrong [facility] code.  Wrong: NDC code ending in 008 Should use: NDC code ending in 009  May need another PA for Mavenclad 8 tablets. Will let us  know by end of day.

## 2024-06-17 NOTE — Telephone Encounter (Signed)
 PA for Mavenclad 8 tablets was approved:

## 2024-06-26 ENCOUNTER — Ambulatory Visit
Admission: RE | Admit: 2024-06-26 | Discharge: 2024-06-26 | Disposition: A | Discharge status: Home / Self Care | Source: Ambulatory Visit

## 2024-06-26 ENCOUNTER — Other Ambulatory Visit: Payer: Self-pay | Admitting: Neurology

## 2024-06-26 DIAGNOSIS — G35A Relapsing-remitting multiple sclerosis: Secondary | ICD-10-CM

## 2024-06-26 DIAGNOSIS — Z1231 Encounter for screening mammogram for malignant neoplasm of breast: Secondary | ICD-10-CM

## 2024-06-26 DIAGNOSIS — Z79899 Other long term (current) drug therapy: Secondary | ICD-10-CM

## 2024-06-26 NOTE — Telephone Encounter (Signed)
 Received this from [facility] [employer]:

## 2024-06-27 ENCOUNTER — Telehealth: Payer: Self-pay | Admitting: *Deleted

## 2024-06-27 NOTE — Telephone Encounter (Signed)
 Noted

## 2024-06-27 NOTE — Telephone Encounter (Signed)
 Mavenclad treatment Year 2 Month 2 shipped 06-11-2024.  Lab orders in [facility]IC.

## 2024-07-02 ENCOUNTER — Other Ambulatory Visit (HOSPITAL_COMMUNITY): Payer: Self-pay

## 2024-07-09 NOTE — Telephone Encounter (Signed)
 SABRA

## 2024-08-14 ENCOUNTER — Other Ambulatory Visit: Payer: Self-pay

## 2024-08-14 DIAGNOSIS — Z006 Encounter for examination for normal comparison and control in clinical research program: Secondary | ICD-10-CM

## 2024-08-21 ENCOUNTER — Other Ambulatory Visit: Payer: Self-pay | Admitting: Neurology

## 2024-08-21 NOTE — Telephone Encounter (Signed)
 Last seen 04/22/24 Next appt 11/13/24 Dispenses   Dispensed Days Supply Quantity Provider Pharmacy  PHENTERMINE  37.5 MG CAPSULE 05/07/2024 30 30 each Vear Charlie LABOR, MD [facility] [phone] [facility] ...  PHENTERMINE  37.5 MG CAPSULE 04/08/2024 30 30 each Vear Charlie LABOR, [facility] [phone] [facility] - ...  PHENTERMINE  37.5 MG CAPSULE 02/16/2024 30 30 each Vear Charlie LABOR, [facility] [phone] [facility] ...  PHENTERMINE  37.5 MG CAPSULE 11/29/2023 30 30 each Vear Charlie LABOR, MD [facility] [phone] [facility] - ...  PHENTERMINE  37.5 MG CAPSULE 09/06/2023 30 30 each Sater, Charlie LABOR, MD [facility] [phone] [facility] - .SABRASABRA

## 2024-08-27 ENCOUNTER — Other Ambulatory Visit (HOSPITAL_COMMUNITY): Payer: Self-pay

## 2024-11-13 ENCOUNTER — Ambulatory Visit: Admitting: Neurology
